# Patient Record
Sex: Female | Born: 1950 | Race: Black or African American | Hispanic: No | Marital: Single
Health system: Southern US, Community
[De-identification: ages and names within clinical notes are randomized; demographics above are authoritative.]

## PROBLEM LIST (undated history)

## (undated) HISTORY — PX: BREAST BIOPSY: SHX20

---

## 1999-08-17 HISTORY — PX: BREAST BIOPSY: SHX20

## 2016-11-08 ENCOUNTER — Encounter (INDEPENDENT_AMBULATORY_CARE_PROVIDER_SITE_OTHER): Payer: Self-pay

## 2016-11-08 ENCOUNTER — Ambulatory Visit: Payer: Self-pay | Attending: Oncology | Admitting: *Deleted

## 2016-11-08 ENCOUNTER — Ambulatory Visit
Admission: RE | Admit: 2016-11-08 | Discharge: 2016-11-08 | Disposition: A | Discharge status: Home / Self Care | Payer: Self-pay | Source: Ambulatory Visit | Attending: Oncology | Admitting: Oncology

## 2016-11-08 ENCOUNTER — Encounter: Payer: Self-pay | Admitting: *Deleted

## 2016-11-08 VITALS — BP 144/91 | HR 65 | Temp 97.4°F | Ht 63.0 in | Wt 150.0 lb

## 2016-11-08 DIAGNOSIS — Z0000 Encounter for general adult medical examination without abnormal findings: Secondary | ICD-10-CM

## 2016-11-08 NOTE — Patient Instructions (Signed)
Gave patient hand-out, Women Staying Healthy, Active and Well from [facility], with education on breast health, pap smears, heart and colon health. 

## 2016-11-08 NOTE — Progress Notes (Signed)
Subjective:     Patient ID: Marissa George, female   DOB: [birth year], 66 y.o.   MRN: [id number]  HPI   Review of Systems     Objective:   Physical Exam  Pulmonary/Chest: Right breast exhibits no inverted nipple, no mass, no nipple discharge, no skin change and no tenderness. Left breast exhibits no inverted nipple, no mass, no nipple discharge, no skin change and no tenderness. Breasts are symmetrical.         Assessment:     66 year old Black female [facility] and [facility].  Clinical breast exam unremarkable.  Taught self breast awareness.  [place] mammogram and pap smear was in [place] in 2016.  Explained that she will need to sign consent for release of information to get those images for comparison.  Explained we will do a pap smear next year.  Patient has been screened for eligibility.  She does not have any insurance, Medicare or Medicaid.  She also meets financial eligibility.  Hand-out given on the Affordable Care Act.    Plan:     Screening mammogram ordered.  Will follow-up per [facility] protocol.

## 2016-11-12 ENCOUNTER — Other Ambulatory Visit: Payer: Self-pay

## 2016-11-12 DIAGNOSIS — N63 Unspecified lump in unspecified breast: Secondary | ICD-10-CM

## 2016-11-24 ENCOUNTER — Ambulatory Visit
Admission: RE | Admit: 2016-11-24 | Discharge: 2016-11-24 | Disposition: A | Discharge status: Home / Self Care | Payer: Self-pay | Source: Ambulatory Visit | Attending: Oncology | Admitting: Oncology

## 2016-11-24 DIAGNOSIS — N63 Unspecified lump in unspecified breast: Secondary | ICD-10-CM

## 2016-11-25 ENCOUNTER — Encounter: Payer: Self-pay | Admitting: *Deleted

## 2016-11-25 NOTE — Progress Notes (Signed)
Letter mailed from the [facility] to inform patient of her normal mammogram results.  Patient is to follow-up with annual screening in one year.  HSIS to Christy. 

## 2016-11-30 ENCOUNTER — Other Ambulatory Visit: Payer: Self-pay | Admitting: *Deleted

## 2016-11-30 ENCOUNTER — Inpatient Hospital Stay
Admission: RE | Admit: 2016-11-30 | Discharge: 2016-11-30 | Disposition: A | Discharge status: Home / Self Care | Payer: Self-pay | Source: Ambulatory Visit | Attending: *Deleted | Admitting: *Deleted

## 2016-11-30 DIAGNOSIS — Z9289 Personal history of other medical treatment: Secondary | ICD-10-CM

## 2019-05-14 ENCOUNTER — Other Ambulatory Visit: Payer: Self-pay | Admitting: Student

## 2019-05-14 DIAGNOSIS — Z1231 Encounter for screening mammogram for malignant neoplasm of breast: Secondary | ICD-10-CM

## 2019-05-30 ENCOUNTER — Ambulatory Visit
Admission: RE | Admit: 2019-05-30 | Discharge: 2019-05-30 | Disposition: A | Discharge status: Home / Self Care | Payer: Medicare Other | Source: Ambulatory Visit | Attending: Family Medicine | Admitting: Family Medicine

## 2019-05-30 ENCOUNTER — Other Ambulatory Visit: Payer: Self-pay

## 2019-05-30 ENCOUNTER — Encounter (INDEPENDENT_AMBULATORY_CARE_PROVIDER_SITE_OTHER): Payer: Self-pay

## 2019-05-30 DIAGNOSIS — Z1231 Encounter for screening mammogram for malignant neoplasm of breast: Secondary | ICD-10-CM | POA: Insufficient documentation

## 2019-09-27 ENCOUNTER — Ambulatory Visit: Payer: Medicare Other

## 2020-05-06 ENCOUNTER — Other Ambulatory Visit: Payer: Self-pay | Admitting: Student

## 2021-07-29 IMAGING — MG DIGITAL SCREENING BILAT W/ TOMO W/ CAD
8 series · 8 of 24 positions shown · non-contrast
Comparison: Previous exam(s).

CLINICAL DATA: Screening.

EXAM:
DIGITAL SCREENING BILATERAL MAMMOGRAM WITH TOMO AND CAD

[R CC synth-2D]
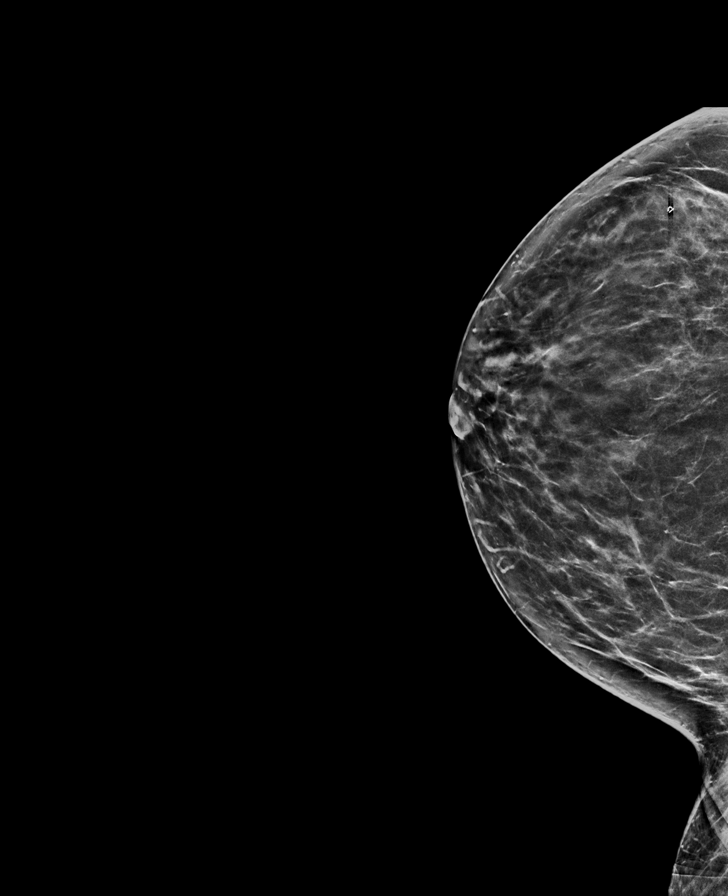

[L MLO synth-2D]
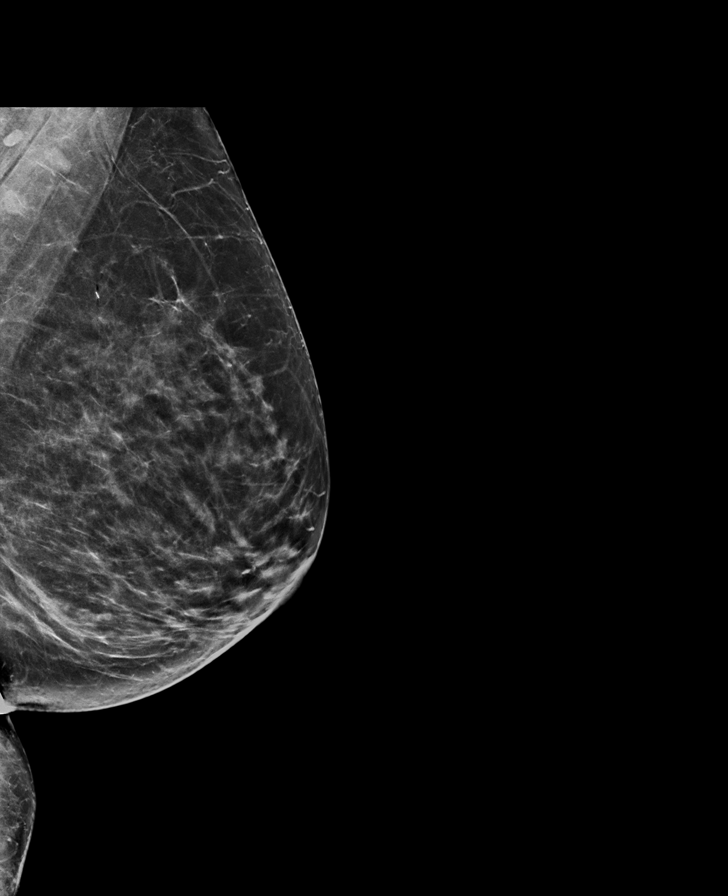

[L CC synth-2D]
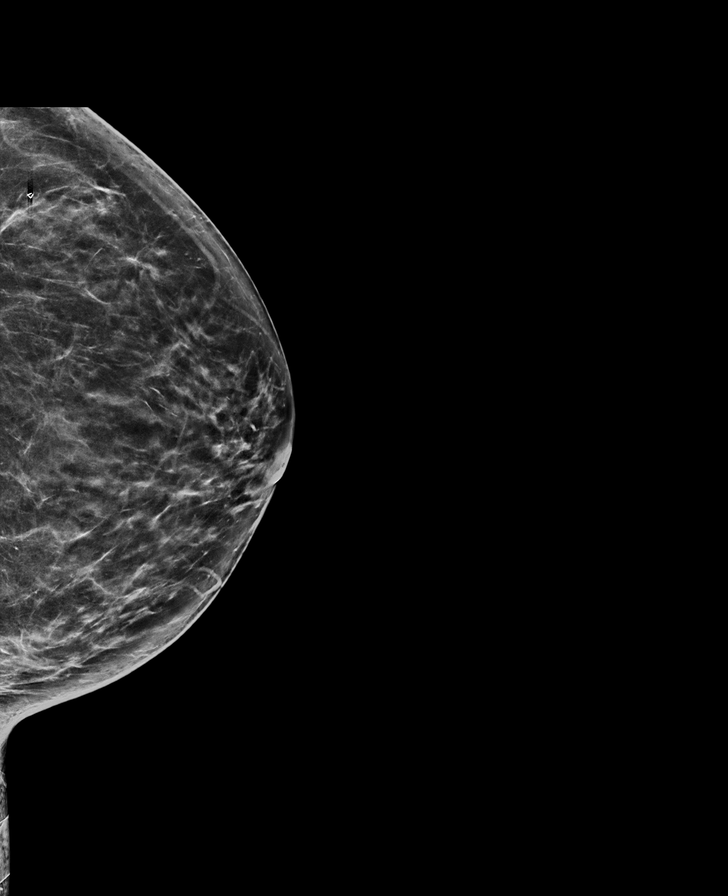

[R MLO synth-2D]
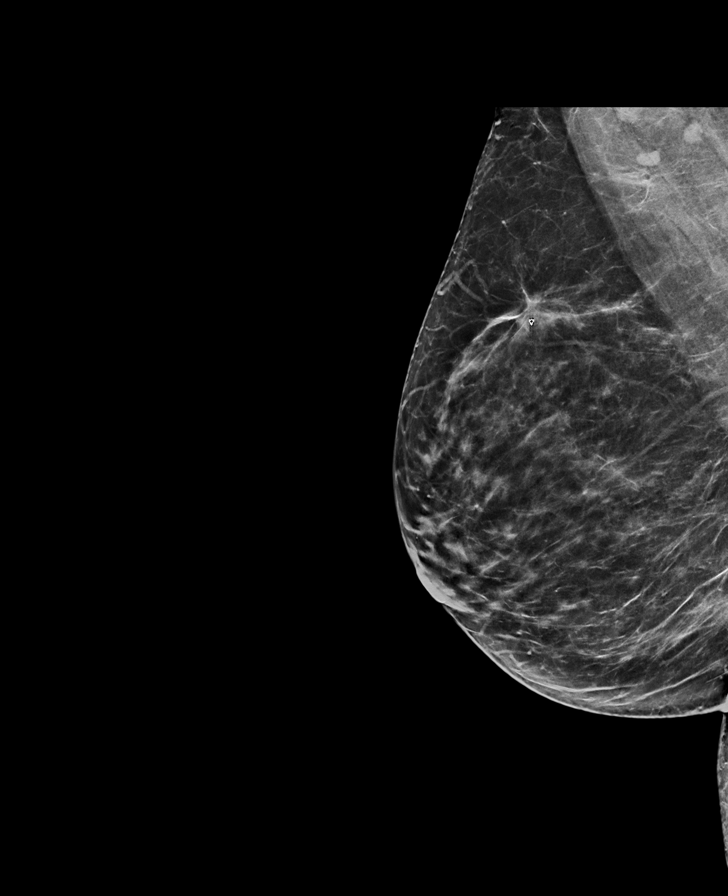

[L MLO tomo · tomo slice 39/76.0]
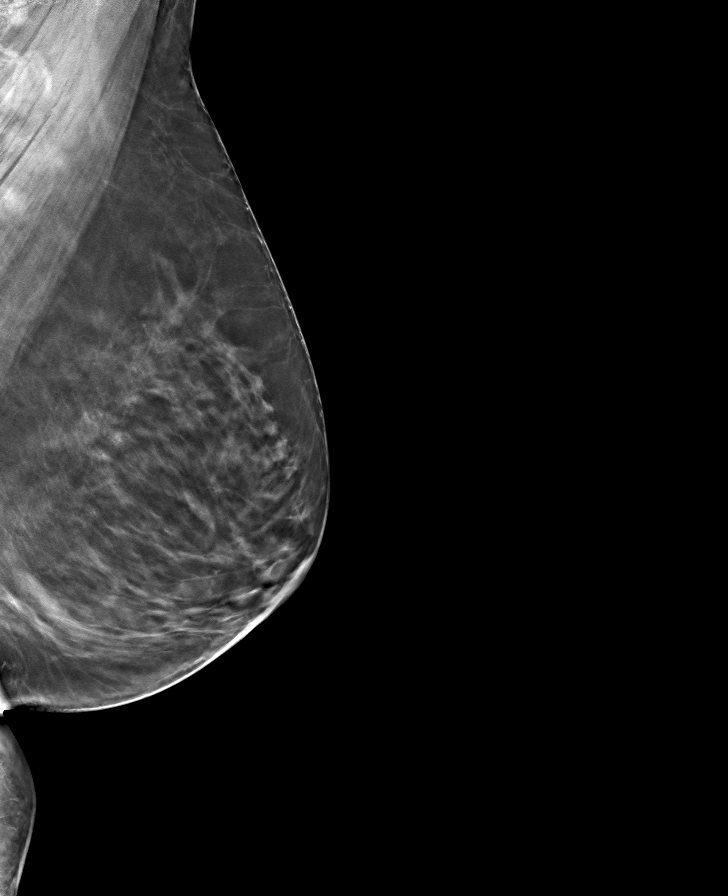

[L CC tomo · tomo slice 37/72.0]
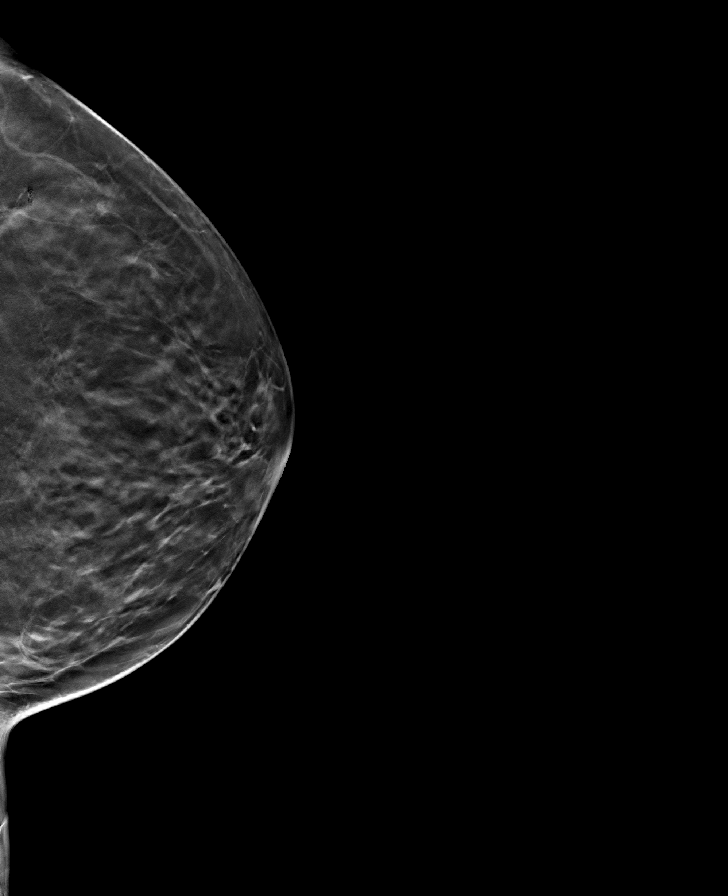

[R MLO tomo · tomo slice 38/75.0]
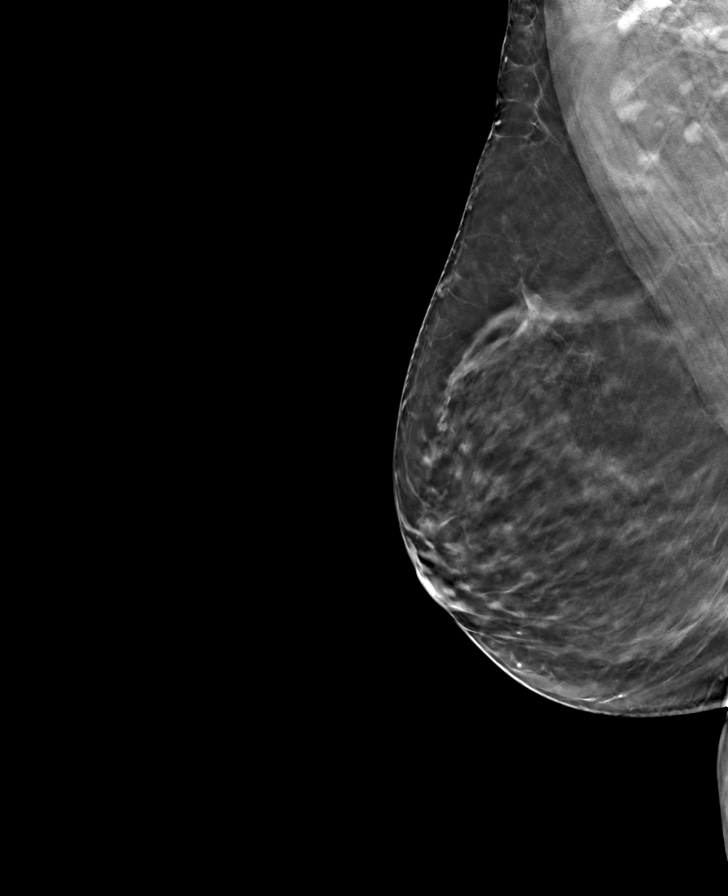

[R CC tomo · tomo slice 35/70.0]
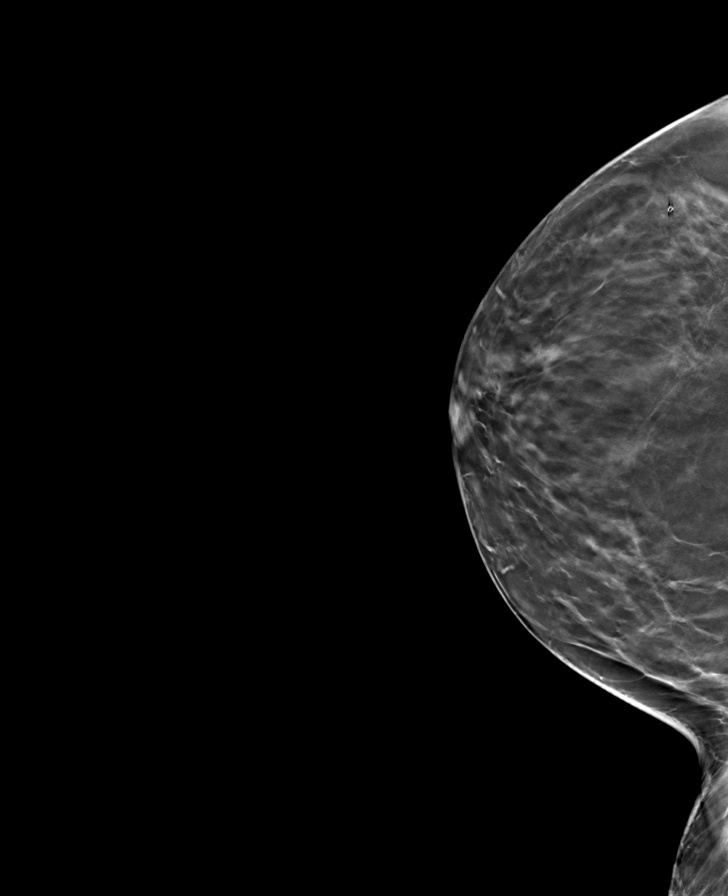

[8 of 24 positions shown; findings below may reference images not displayed]

ACR Breast Density Category c: The breast tissue is heterogeneously
dense, which may obscure small masses.
FINDINGS: There are no findings suspicious for malignancy. Images were
processed with CAD.
IMPRESSION: No mammographic evidence of malignancy. A result letter of this
screening mammogram will be mailed directly to the patient.

RECOMMENDATION:
Screening mammogram in one year. (Code:[id number]-[id number])

BI-RADS CATEGORY  1: Negative.

## 2023-01-17 ENCOUNTER — Ambulatory Visit: Payer: Medicare Other | Attending: Family Medicine

## 2023-01-17 DIAGNOSIS — M25511 Pain in right shoulder: Secondary | ICD-10-CM | POA: Insufficient documentation

## 2023-01-17 DIAGNOSIS — G8929 Other chronic pain: Secondary | ICD-10-CM | POA: Insufficient documentation

## 2023-01-17 DIAGNOSIS — M6281 Muscle weakness (generalized): Secondary | ICD-10-CM | POA: Diagnosis present

## 2023-01-17 NOTE — Therapy (Signed)
[facility] Physical Therapy [address] [place], [place], [address] Phone: [phone]   Fax:  [phone]  Outpatient Physical Therapy Evaluation  Patient Details  Name: Marissa George MRN: [id number] Date of Birth: Jul 04, 1951 Referring Provider (PT): Katherina Right MD (Primary care physician)   Encounter Date: 01/17/2023   PT End of Session - 01/17/23 0958     Visit Number 1    Number of Visits 16    Date for PT Re-Evaluation 03/14/23    Authorization Type Medicare A & B    Authorization Time Period 01/17/23-03/14/23    Progress Note Due on Visit 10    PT Start Time 0900    PT Stop Time 0945    PT Time Calculation (min) 45 min    Activity Tolerance Patient tolerated treatment well;No increased pain    Behavior During Therapy [facility] for tasks assessed/performed             No past medical history on file.  Past Surgical History:  Procedure Laterality Date   BREAST BIOPSY Left 2001   neg   BREAST BIOPSY Right    neg    There were no vitals filed for this visit.    Subjective Assessment -     Subjective Pt reports inisious injury to Rt shoulder, initially unable to lift arm into ABD or flexion, but better able to move things, it just feels weak and retricted, unable to tuck in shirts behind back on Rt or reach back of neck on Right.    Pertinent History Pt is referred to OPPT for acute ongoing Rt shoulder pain that started ~6 months prior to evaluation (early Jan 2024). She was referred to orthopedics but her appointment canceled 3x. 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    Currently in Pain? Yes    Pain Score 6     Pain Location Shoulder   sore in anterior shoulder   Pain Orientation Right                [facility] PT Assessment - 01/17/23 0001       Assessment    Medical Diagnosis Rt shoulder pain    Referring Provider (PT) Katherina Right MD   Primary care physician   Onset Date/Surgical Date 08/16/22   estimated   Hand Dominance Right    Prior Therapy For low back at another clinic, finished in December 2023      Balance Screen   Has the patient fallen in the past 6 months No    Has the patient had a decrease in activity level because of a fear of falling?  Yes    Is the patient reluctant to leave their home because of a fear of falling?  No      Prior Function   Level of Independence Independent;Independent with basic ADLs;Independent with gait;Independent with transfers;Independent with community mobility without device      Observation/Other Assessments   Focus on Therapeutic Outcomes (FOTO)  55      Sensation   Light Touch Impaired Detail    Light Touch Impaired Details Impaired RUE   has been followed for Rt hand numbness to volar digits 2, 3, and 4, no palmar sensory changes. 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, has been scheduled for NCS  Additional Assessment:  A/ROM:  Rt shoulder flexion: 128 degrees,  Rt shoulder ABDCT: 78 degrees  Right shoulder ER: T1  Right shoulder IR: T6  P/ROM:  Rt shoulder flexion 138 degrees (end range pain) Rt shoulder ABDCT: 114 degrees (end range pain)  Rt shoulder ER 70 degrees (guarded but able to get to end-range)  Rt shoulder IR: 65  MMT:  Rt shoulder IR 5/5 painful  Rt shoulder ER 5/5 (just a little sore) Rt shoulder flexion (supine 0-90), ok Right shoulder flexion (standing) 5/5 bilat  Right infraspinatus MMT: 5/5 pain free Right elbow flexion 4+/5 (r/o biceps tendon) ok Right extension 5/5 ok  Palpation: -Anterior shoulder tenderness with pain when Rt subscapularis tendon is exposed -lateral subscapularis palpation abnormal "feels like being poked with a needle"   Postural assessment:  -very mild anterior deviation of scapular with mild tightness Rt calvicular pec major, Pec  minor also tight, but both WNL.   Objective measurements completed on examination: See above findings.    HEP Education: -seated Rt shoulder adduction isometrics with towel roll 15x5secH  -seated blueTB row with endrange scapular retraction 1x10x3secH  -(unable to find a picture of shoulder isometric IR doorframe in a neutral GHJ position, deferred to next visit)      PT Education - 01/17/23 0957     Education Details Results of examination today and plan for addressing deficits; educated on HEP activity    Person(s) Educated Patient    Methods Explanation;Demonstration;Tactile cues;Verbal cues;Handout    Comprehension Verbalized understanding;Returned demonstration;Need further instruction;Tactile cues required;Verbal cues required              PT Short Term Goals -       PT SHORT TERM GOAL #1   Title Pt to reports tolerance and compliance to HEP assignment.    Baseline new    Time 2    Period Weeks    Status New    Target Date 01/31/23      PT SHORT TERM GOAL #2   Title Pt to report improved use of RUE in ADL and household activity, less pain, less perceived weakness.    Baseline compensating with LUE, arm feels weak when lifting heavy kitchen items    Time 3    Period Weeks    Status New    Target Date 02/07/23               PT Long Term Goals       PT LONG TERM GOAL #1   Title Pt to report less functional disability of RUE AEB score on FOTO survey score improvent >12.    Baseline eval: 55    Time 8    Period Weeks    Status New    Target Date 03/14/23      PT LONG TERM GOAL #2   Title Pt to demonstrate 5/5 and painfree MMT RUE shoulder flexion, ABDCT, IR, ER to indicated improved funcitonal load tolerance.    Baseline Pain with most of these at eval, most pain with resisted IR and deep palpation of subscapularis tendon.    Time 8    Period Weeks    Status New    Target Date 03/14/23      PT LONG TERM GOAL #3   Title Pt to report beginning to  integrate overhead lifts as desired to personal training session without exacerbation of pain.    Baseline eval: avoiding overhead lifts with trainer    Time 8  Period Weeks    Status New    Target Date 03/14/23               Plan -     Clinical Impression Statement Pt presenting with ongoinging stable Rt shoulder pain and weakness, limited ROM almost 6 months after inisdious severe loss of ROM and onset pain. Examination does not reveal any concerning neurological symptoms. Pain is focal to anterior shoulder, worse with deep palpation of subscapularis tendon. Pt has pain with resisted GHJ internal rotation, less with resisted external rotation. Pt is able to perform isometric shoulder flexion MMT without concerning weakness or pain. There are big gaps between A/ROM and P/ROM. Pt will benefit from skilled PT intervention to address deficits and impairment identified in this evaluation. Will plan on ~10 visits of PT to ascertain rehab prognosis, but if no significant improvement at that time, will further discuss advanced imaging and orthopedics consult.    Personal Factors and Comorbidities Fitness;Behavior Pattern;Time since onset of injury/illness/exacerbation    Examination-Activity Limitations Reach Overhead;Bathing;Carry;Dressing;Hygiene/Grooming    Examination-Participation Restrictions Meal Prep;Cleaning;Community Activity;Laundry;Yard Work    Stability/Clinical Decision Making Stable/Uncomplicated    Clinical Decision Making Moderate    Rehab Potential Excellent    PT Frequency 2x / week    PT Duration 8 weeks    PT Treatment/Interventions Electrical Stimulation;Moist Heat;Functional mobility training;Therapeutic exercise;Therapeutic activities;Patient/family education;Manual techniques;Dry needling;Passive range of motion    PT Next Visit Plan Review HEP from evaluation, find appopriate isometric for IR to add if tolerated in clinic.    PT Home Exercise Plan Eval: isometric  towel roll shoulder ADD, seated blueTB row    Consulted and Agree with Plan of Care Patient             Patient will benefit from skilled therapeutic intervention in order to improve the following deficits and impairments:  Impaired UE functional use, Increased muscle spasms, Decreased activity tolerance, Decreased strength, Impaired sensation  Visit Diagnosis: Muscle weakness (generalized)  Chronic right shoulder pain     Problem List There are no problems to display for this patient.  10:27 AM, 01/17/23 Rosamaria Lints, PT, DPT Physical Therapist - [facility] Therapy in [place]  [phone] [facility])     [place], PT 01/17/2023, 10:14 AM  [facility] Physical Therapy [address] [place], [place], [address] Phone: [phone]   Fax:  [phone]  Name: Marissa George MRN: [id number] Date of Birth: [birth year]

## 2023-01-19 ENCOUNTER — Ambulatory Visit: Payer: Medicare Other | Admitting: Physical Therapy

## 2023-01-19 ENCOUNTER — Encounter: Payer: Self-pay | Admitting: Physical Therapy

## 2023-01-19 DIAGNOSIS — M6281 Muscle weakness (generalized): Secondary | ICD-10-CM | POA: Diagnosis not present

## 2023-01-19 DIAGNOSIS — G8929 Other chronic pain: Secondary | ICD-10-CM

## 2023-01-19 NOTE — Therapy (Signed)
OUTPATIENT PHYSICAL THERAPY TREATMENT NOTE   Patient Name: Marissa George MRN: [id number] DOB:[birth year], 72 y.o., female Today's Date: 01/19/2023   END OF SESSION:   PT End of Session - 01/19/23 0938     Visit Number 2    Number of Visits 16    Date for PT Re-Evaluation 03/14/23    Authorization Type Medicare A & B    Authorization Time Period 01/17/23-03/14/23    Progress Note Due on Visit 10    PT Start Time 0908    PT Stop Time 0948    PT Time Calculation (min) 40 min    Activity Tolerance Patient tolerated treatment well;No increased pain    Behavior During Therapy [facility] for tasks assessed/performed             History reviewed. No pertinent past medical history. Past Surgical History:  Procedure Laterality Date   BREAST BIOPSY Left 2001   neg   BREAST BIOPSY Right    neg   There are no problems to display for this patient.    PCP: Antonieta Loveless, MD REFERRING PROVIDER: Katherina Right, MD  REFERRING DIAG: [phone] (ICD-10-CM) - Pain in right shoulder   THERAPY DIAG:  Muscle weakness (generalized)  Chronic right shoulder pain  Rationale for Evaluation and Treatment Rehabilitation  PERTINENT HISTORY: Pt reports inisious injury to Rt shoulder, initially unable to lift arm into ABD or flexion, but better able to move things, it just feels weak and retricted, unable to tuck in shirts behind back on Rt or reach back of neck on Right   Pt is referred to OPPT for acute ongoing Rt shoulder pain that started ~6 months prior to evaluation (early Jan 2024). She was referred to orthopedics but her appointment canceled 3x. 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 Patient reports some soreness after initial eval, but generally tolerating it well. She is awaiting NCV study for suspected carpel tunnel syndrome of R upper limb.    PAIN:  Are you having pain? Pt denies pain at rest, pain with resisted shoulder elevation and overhead activity/reaching behind back  OBJECTIVE: (objective measures completed at initial evaluation unless otherwise dated)      [facility] PT Assessment - 01/17/23 0001                Assessment    Medical Diagnosis Rt shoulder pain     Referring Provider (PT) Katherina Right MD   Primary care physician    Onset Date/Surgical Date 08/16/22   estimated    Hand Dominance Right     Prior Therapy For low back at another clinic, finished in December 2023          Balance Screen    Has the patient fallen in the past 6 months No     Has the patient had a decrease in activity level because of a fear of falling?  Yes     Is the patient reluctant to leave their home because of a fear of falling?  No          Prior Function    Level of Independence Independent;Independent with basic ADLs;Independent with gait;Independent with transfers;Independent with community mobility without device          Observation/Other Assessments    Focus on Therapeutic Outcomes (FOTO)  55          Sensation    Light Touch Impaired Detail     Light Touch Impaired Details Impaired RUE   has been followed for Rt hand numbness to volar digits 2, 3, and 4, no palmar sensory changes. 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, has been scheduled for NCS                A/ROM:  Rt shoulder flexion: 128 degrees,  Rt shoulder ABDCT: 78 degrees  Right shoulder ER: T1  Right shoulder IR: T6   P/ROM:  Rt shoulder flexion 138 degrees (end range pain) Rt shoulder ABDCT: 114  degrees (end range pain)  Rt shoulder ER 70 degrees (guarded but able to get to end-range)  Rt shoulder IR: 65   MMT:  Rt shoulder IR 5/5 painful  Rt shoulder ER 5/5 (just a little sore) Rt shoulder flexion (supine 0-90), ok Right shoulder flexion (standing) 5/5 bilat  Right infraspinatus MMT: 5/5 pain free Right elbow flexion 4+/5 (r/o biceps tendon) ok Right extension 5/5 ok   Palpation: -Anterior shoulder tenderness with pain when Rt subscapularis tendon is exposed -lateral subscapularis palpation abnormal "feels like being poked with a needle"    Postural assessment:  -very mild anterior deviation of scapulae with mild tightness Rt calvicular pec major, Pec minor also tight, but both WNL.    Passive Accessory Motion (01/19/23) Glenohumeral Joint: anterior glide: WNL, posterior glide: mild restriction, inferior glide: moderate restriction at 90 deg ABD      TREATMENT TODAY   Manual Therapy - for symptom modulation, soft tissue sensitivity and mobility, joint mobility, ROM   R shoulder PROM within pt tolerance x 2 minutes;   -min motion loss with all directions, pain with end-range flexion and abduction  Glenohumeral mobilization, inferior; gr II-III; 2 x 30 sec bouts per grade  STM R anterior and middle deltoid, pectoralis major x 8 minutes;  With R shoulder in 120 deg abduction, STM of R subscapularis; x 2 minutes    Trigger Point Dry Needling (TDN), unbilled Education performed with patient regarding potential benefit of TDN. Reviewed precautions and risks with patient. Extensive time spent with pt to ensure full understanding of TDN risks and post-treatment expectations. Pt provided verbal consent to treatment.  TDN performed to R middle deltoid, R pectoralis major, and R subscapularis with 0.25 x 40 single needle placements with local twitch response (LTR). Pistoning technique utilized. Improved pain-free motion following intervention.     Therapeutic Exercise -  for improved soft tissue flexibility and extensibility as needed for ROM, isometrics for analgesic effect and to promote soft tissue healing, periscapular re-training for scapular positioning and scapulothoracic mechanics   Pec stretch in doorway, shoulders at 90 deg abduction; 2 x 30 sec Shoulder IR isometric at door frame, x 10, 5 sec isometric hold   PATIENT EDUCATION: We discussed initial exercises given by Alvera Novel, PT, DPT. We discussed increased hydration and continued HEP following DN. HEP updated and new [employer] handout given.     HOME EXERCISE PROGRAM Per Alvera Novel, DPT at initial eval -seated Rt shoulder adduction isometrics with towel roll 15x5secH  -seated blueTB row with endrange scapular retraction 1x10x3secH   Access Code: [email or web address] URL: [email or web address] Date: 01/19/2023 Prepared by: Consuela Mimes  Exercises - Standing Isometric Shoulder Internal Rotation at Doorway  - 2 x daily - 7 x weekly - 2 sets - 10 reps - 5 sec hold - Doorway Pec Stretch at 90 Degrees Abduction  - 2 x daily - 7 x weekly - 3 sets - 30sec hold       PT Short Term Goals -                PT SHORT TERM GOAL #1    Title Pt to reports tolerance and compliance to HEP assignment.     Baseline Eval: baseline HEP initiated.     Time 2     Period Weeks     Status New     Target Date 01/31/23          PT SHORT TERM GOAL #2    Title Pt to report improved use of RUE in ADL and household activity, less pain, less perceived weakness.     Baseline Eval: compensating with LUE, arm feels weak when lifting heavy kitchen items.    Time 3     Period Weeks     Status New     Target Date 02/07/23                       PT Long Term Goals                PT LONG TERM GOAL #1    Title Pt to report less functional disability of RUE AEB score on FOTO survey score improvent >12.     Baseline eval: 55     Time 8     Period Weeks     Status New     Target Date  03/14/23          PT LONG TERM GOAL #2    Title Pt to demonstrate 5/5 and painfree MMT RUE shoulder flexion, ABD, IR, ER to indicated improved funcitonal load tolerance.     Baseline Pain with most of these at eval, most pain with resisted IR and deep palpation of subscapularis tendon.     Time 8     Period Weeks     Status New     Target Date 03/14/23          PT LONG TERM GOAL #3    Title Pt to report beginning to integrate overhead lifts as desired to personal training session without exacerbation of pain.  Baseline eval: avoiding overhead lifts with trainer     Time 8     Period Weeks     Status New     Target Date 03/14/23                       Plan -       Clinical Impression Statement Patient arrives with excellent motivation to participate in physical therapy. She has ongoing palpable tenderness and reproduction of "needle stick" feeling with subscapularis palpation. Pt also has TTP along middle/anterior deltoid and outer pectoralis major. She demonstrates improved shoulder flexion and abduction AROM relative to baseline IE measures following dry needling today. Pt tolerates doorway IR isometrics well. Her program was updated to include pec stretching and doorway IR isometric work. Patient has remaining deficits in shoulder AROM with minimal PROM motion deficits, mobility, postural changes/anteriorly tilted and protracted scapulae, taut/tender subscap/deltoid/pec, and local nociceptive anterior shoulder pain; pt has comorbid R upper limb paresthesias (awaiting NCV study). Patient will benefit from continued skilled therapeutic intervention to address the above deficits as needed for improved function and QoL.      Personal Factors and Comorbidities Fitness;Behavior Pattern;Time since onset of injury/illness/exacerbation     Examination-Activity Limitations Reach Overhead;Bathing;Carry;Dressing;Hygiene/Grooming     Examination-Participation Restrictions Meal  Prep;Cleaning;Community Activity;Laundry;Yard Work     Stability/Clinical Decision Making Stable/Uncomplicated     Clinical Decision Making Moderate     Rehab Potential Excellent     PT Frequency 2x / week     PT Duration 8 weeks     PT Treatment/Interventions Electrical Stimulation;Moist Heat;Functional mobility training;Therapeutic exercise;Therapeutic activities;Patient/family education;Manual techniques;Dry needling;Passive range of motion     PT Next Visit Plan F/u on tolerance of current HEP. Continue with specific stretching and periscapular re-training to reduce anterior scapular tilt/protraction, continued with isometrics. F/u on dry needling response. Progressive ROM with use of serratus activation/periscap re-training.    PT Home Exercise Plan See above    Consulted and Agree with Plan of Care Patient                   Patient will benefit from skilled therapeutic intervention in order to improve the following deficits and impairments:  Impaired UE functional use, Increased muscle spasms, Decreased activity tolerance, Decreased strength, Impaired sensation   Visit Diagnosis: Muscle weakness (generalized)   Chronic right shoulder pain      Consuela Mimes, PT, DPT #[id number]  Gertie Exon, PT 01/19/2023, 9:54 AM

## 2023-01-24 ENCOUNTER — Encounter: Payer: Self-pay | Admitting: Physical Therapy

## 2023-01-24 ENCOUNTER — Ambulatory Visit: Payer: Medicare Other | Admitting: Physical Therapy

## 2023-01-24 DIAGNOSIS — M6281 Muscle weakness (generalized): Secondary | ICD-10-CM | POA: Diagnosis not present

## 2023-01-24 DIAGNOSIS — G8929 Other chronic pain: Secondary | ICD-10-CM

## 2023-01-24 NOTE — Therapy (Signed)
OUTPATIENT PHYSICAL THERAPY TREATMENT NOTE   Patient Name: Marissa George MRN: [id number] DOB:[birth year], 72 y.o., female Today's Date: 01/24/2023   END OF SESSION:   PT End of Session - 01/24/23 1406     Visit Number 3    Number of Visits 16    Date for PT Re-Evaluation 03/14/23    Authorization Type Medicare A & B    Authorization Time Period 01/17/23-03/14/23    Progress Note Due on Visit 10    PT Start Time 1334    PT Stop Time 1417    PT Time Calculation (min) 43 min    Activity Tolerance Patient tolerated treatment well;No increased pain    Behavior During Therapy [facility] for tasks assessed/performed              History reviewed. No pertinent past medical history. Past Surgical History:  Procedure Laterality Date   BREAST BIOPSY Left 2001   neg   BREAST BIOPSY Right    neg   There are no problems to display for this patient.    PCP: Antonieta Loveless, MD REFERRING PROVIDER: Katherina Right, MD  REFERRING DIAG: [phone] (ICD-10-CM) - Pain in right shoulder   THERAPY DIAG:  Muscle weakness (generalized)  Chronic right shoulder pain  Rationale for Evaluation and Treatment Rehabilitation  PERTINENT HISTORY: Pt reports inisious injury to Rt shoulder, initially unable to lift arm into ABD or flexion, but better able to move things, it just feels weak and retricted, unable to tuck in shirts behind back on Rt or reach back of neck on Right   Pt is referred to OPPT for acute ongoing Rt shoulder pain that started ~6 months prior to evaluation (early Jan 2024). She was referred to orthopedics but her appointment canceled 3x. 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]      SUBJECTIVE:                                                                                                                                                                                       SUBJECTIVE STATEMENT:  Pt is awaiting NCV study. Patient reports some stiffness after leaving last visit. She reports it loosened up over the weekend. Patient reports no pain at rest. She reports doing well with home exercises last visit.    PAIN:  Are you having pain? Pt denies pain at rest, pain with resisted shoulder elevation and overhead activity/reaching behind  back    OBJECTIVE: (objective measures completed at initial evaluation unless otherwise dated)      [facility] PT Assessment - 01/17/23 0001                Assessment    Medical Diagnosis Rt shoulder pain     Referring Provider (PT) Katherina Right MD   Primary care physician    Onset Date/Surgical Date 08/16/22   estimated    Hand Dominance Right     Prior Therapy For low back at another clinic, finished in December 2023          Balance Screen    Has the patient fallen in the past 6 months No     Has the patient had a decrease in activity level because of a fear of falling?  Yes     Is the patient reluctant to leave their home because of a fear of falling?  No          Prior Function    Level of Independence Independent;Independent with basic ADLs;Independent with gait;Independent with transfers;Independent with community mobility without device          Observation/Other Assessments    Focus on Therapeutic Outcomes (FOTO)  55          Sensation    Light Touch Impaired Detail     Light Touch Impaired Details Impaired RUE   has been followed for Rt hand numbness to volar digits 2, 3, and 4, no palmar sensory changes. 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, has been scheduled for NCS                A/ROM:  Rt shoulder flexion: 128 degrees,  Rt shoulder ABDCT: 78 degrees  Right shoulder ER: T1  Right shoulder IR: T6   P/ROM:  Rt shoulder flexion 138 degrees (end range pain) Rt shoulder ABDCT: 114  degrees (end range pain)  Rt shoulder ER 70 degrees (guarded but able to get to end-range)  Rt shoulder IR: 65   MMT:  Rt shoulder IR 5/5 painful  Rt shoulder ER 5/5 (just a little sore) Rt shoulder flexion (supine 0-90), ok Right shoulder flexion (standing) 5/5 bilat  Right infraspinatus MMT: 5/5 pain free Right elbow flexion 4+/5 (r/o biceps tendon) ok Right extension 5/5 ok   Palpation: -Anterior shoulder tenderness with pain when Rt subscapularis tendon is exposed -lateral subscapularis palpation abnormal "feels like being poked with a needle"    Postural assessment:  -very mild anterior deviation of scapulae with mild tightness Rt calvicular pec major, Pec minor also tight, but both WNL.    Passive Accessory Motion (01/19/23) Glenohumeral Joint: anterior glide: WNL, posterior glide: mild restriction, inferior glide: moderate restriction at 90 deg ABD      TREATMENT TODAY   Manual Therapy - for symptom modulation, soft tissue sensitivity and mobility, joint mobility, ROM   R shoulder PROM within pt tolerance x 2 minutes;   -Mild motion loss with end-range flexion and ABD  Glenohumeral mobilization, inferior; gr III; 2 x 30 sec   STM R anterior and middle deltoid, pectoralis major x 5 minutes;  With R shoulder in 120 deg abduction, STM of R subscapularis; x 2 minutes    Trigger Point Dry Needling (TDN), unbilled Education performed with patient regarding potential benefit of TDN. Reviewed precautions and risks with patient. Extensive time spent with pt to ensure full understanding of TDN risks and post-treatment expectations. Pt provided verbal consent to treatment. TDN performed  to R anterior deltoid, R pectoralis major, and R subscapularis with 0.25 x 40 single needle placements with local twitch response (LTR). Pistoning technique utilized. Improved pain-free motion following intervention.     Therapeutic Exercise - for improved soft tissue flexibility and  extensibility as needed for ROM, isometrics for analgesic effect and to promote soft tissue healing, periscapular re-training for scapular positioning and scapulothoracic mechanics  Prone T; 2x10, 3 sec hold  -tactile and verbal cueing for scapular retraction/depression Pec stretch in doorway, shoulders at 90 deg abduction; 2 x 30 sec Serratus slide with foam roller at wall 1x10  PATIENT EDUCATION: We encouraged continued HEP and use of serratus slide 1x/day to maintain progress with shoulder elevation ROM tolerated    *not today* Shoulder IR isometric at door frame, x 10, 5 sec isometric hold     HOME EXERCISE PROGRAM Per Alvera Novel, DPT at initial eval -seated Rt shoulder adduction isometrics with towel roll 15x5secH  -seated blueTB row with endrange scapular retraction 1x10x3secH   Access Code: [email or web address] URL: [email or web address] Date: 01/19/2023 Prepared by: Consuela Mimes  Exercises - Standing Isometric Shoulder Internal Rotation at Doorway  - 2 x daily - 7 x weekly - 2 sets - 10 reps - 5 sec hold - Doorway Pec Stretch at 90 Degrees Abduction  - 2 x daily - 7 x weekly - 3 sets - 30sec hold       PT Short Term Goals -                PT SHORT TERM GOAL #1    Title Pt to reports tolerance and compliance to HEP assignment.     Baseline Eval: baseline HEP initiated.     Time 2     Period Weeks     Status New     Target Date 01/31/23          PT SHORT TERM GOAL #2    Title Pt to report improved use of RUE in ADL and household activity, less pain, less perceived weakness.     Baseline Eval: compensating with LUE, arm feels weak when lifting heavy kitchen items.    Time 3     Period Weeks     Status New     Target Date 02/07/23                       PT Long Term Goals                PT LONG TERM GOAL #1    Title Pt to report less functional disability of RUE AEB score on FOTO survey score improvent >12.     Baseline eval: 55     Time 8      Period Weeks     Status New     Target Date 03/14/23          PT LONG TERM GOAL #2    Title Pt to demonstrate 5/5 and painfree MMT RUE shoulder flexion, ABD, IR, ER to indicated improved funcitonal load tolerance.     Baseline Pain with most of these at eval, most pain with resisted IR and deep palpation of subscapularis tendon.     Time 8     Period Weeks     Status New     Target Date 03/14/23          PT LONG TERM GOAL #3    Title Pt to report beginning to integrate  overhead lifts as desired to personal training session without exacerbation of pain.     Baseline eval: avoiding overhead lifts with trainer     Time 8     Period Weeks     Status New     Target Date 03/14/23                       Plan -       Clinical Impression Statement Patient has notably improved tolerance of shoulder elevation following dry needling treatment; primary limitation with end-range flexion and abduction. She tolerates progression of periscapular isotonics and serratus activation well today. Patient has remaining deficits in shoulder AROM with minimal PROM motion deficits, mobility, postural changes/anteriorly tilted and protracted scapulae, taut/tender subscap/deltoid/pec, and local nociceptive anterior shoulder pain; pt has comorbid R upper limb paresthesias (awaiting NCV study). Patient will benefit from continued skilled therapeutic intervention to address the above deficits as needed for improved function and QoL.      Personal Factors and Comorbidities Fitness;Behavior Pattern;Time since onset of injury/illness/exacerbation     Examination-Activity Limitations Reach Overhead;Bathing;Carry;Dressing;Hygiene/Grooming     Examination-Participation Restrictions Meal Prep;Cleaning;Community Activity;Laundry;Yard Work     Stability/Clinical Decision Making Stable/Uncomplicated     Clinical Decision Making Moderate     Rehab Potential Excellent     PT Frequency 2x / week     PT Duration 8 weeks      PT Treatment/Interventions Electrical Stimulation;Moist Heat;Functional mobility training;Therapeutic exercise;Therapeutic activities;Patient/family education;Manual techniques;Dry needling;Passive range of motion     PT Next Visit Plan Continue with specific stretching and periscapular re-training to reduce anterior scapular tilt/protraction, continue with isometrics. F/u on dry needling response. Progressive ROM with use of serratus activation/periscap re-training.    PT Home Exercise Plan See above    Consulted and Agree with Plan of Care Patient                   Patient will benefit from skilled therapeutic intervention in order to improve the following deficits and impairments:  Impaired UE functional use, Increased muscle spasms, Decreased activity tolerance, Decreased strength, Impaired sensation   Visit Diagnosis: Muscle weakness (generalized)   Chronic right shoulder pain      Consuela Mimes, PT, DPT #[id number]  Gertie Exon, PT 01/24/2023, 2:45 PM

## 2023-01-26 ENCOUNTER — Ambulatory Visit: Payer: Medicare Other | Admitting: Physical Therapy

## 2023-01-26 ENCOUNTER — Encounter: Payer: Self-pay | Admitting: Physical Therapy

## 2023-01-26 DIAGNOSIS — M6281 Muscle weakness (generalized): Secondary | ICD-10-CM

## 2023-01-26 DIAGNOSIS — G8929 Other chronic pain: Secondary | ICD-10-CM

## 2023-01-26 NOTE — Therapy (Signed)
OUTPATIENT PHYSICAL THERAPY TREATMENT NOTE   Patient Name: Marissa George MRN: [id number] DOB:04/13/1951, 72 y.o., female Today's Date: 01/26/2023   END OF SESSION:   PT End of Session - 01/26/23 0858     Visit Number 4    Number of Visits 16    Date for PT Re-Evaluation 03/14/23    Authorization Type Medicare A & B    Authorization Time Period 01/17/23-03/14/23    Progress Note Due on Visit 10    PT Start Time 0858    PT Stop Time 0940    PT Time Calculation (min) 42 min    Activity Tolerance Patient tolerated treatment well;No increased pain    Behavior During Therapy [facility] for tasks assessed/performed               No past medical history on file. Past Surgical History:  Procedure Laterality Date   BREAST BIOPSY Left 2001   neg   BREAST BIOPSY Right    neg   There are no problems to display for this patient.    PCP: Antonieta Loveless, MD REFERRING PROVIDER: Katherina Right, MD  REFERRING DIAG: [phone] (ICD-10-CM) - Pain in right shoulder   THERAPY DIAG:  Muscle weakness (generalized)  Chronic right shoulder pain  Rationale for Evaluation and Treatment Rehabilitation  PERTINENT HISTORY: Pt reports inisious injury to Rt shoulder, initially unable to lift arm into ABD or flexion, but better able to move things, it just feels weak and retricted, unable to tuck in shirts behind back on Rt or reach back of neck on Right   Pt is referred to OPPT for acute ongoing Rt shoulder pain that started ~6 months prior to evaluation (early Jan 2024). She was referred to orthopedics but her appointment canceled 3x. 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]      SUBJECTIVE:                                                                                                                                                                                       SUBJECTIVE STATEMENT:  Pt reports doing well after last visit and she feels that shoulder elevation improved following DN. Pt reports doing well with current HEP. Pt is continuing personal training 3x/week.    PAIN:  Are you having pain? Pt denies pain at rest, pain with resisted shoulder elevation and overhead activity/reaching behind back    OBJECTIVE: (objective measures  completed at initial evaluation unless otherwise dated)      [facility] PT Assessment - 01/17/23 0001                Assessment    Medical Diagnosis Rt shoulder pain     Referring Provider (PT) Katherina Right MD   Primary care physician    Onset Date/Surgical Date 08/16/22   estimated    Hand Dominance Right     Prior Therapy For low back at another clinic, finished in December 2023          Balance Screen    Has the patient fallen in the past 6 months No     Has the patient had a decrease in activity level because of a fear of falling?  Yes     Is the patient reluctant to leave their home because of a fear of falling?  No          Prior Function    Level of Independence Independent;Independent with basic ADLs;Independent with gait;Independent with transfers;Independent with community mobility without device          Observation/Other Assessments    Focus on Therapeutic Outcomes (FOTO)  55          Sensation    Light Touch Impaired Detail     Light Touch Impaired Details Impaired RUE   has been followed for Rt hand numbness to volar digits 2, 3, and 4, no palmar sensory changes. 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, has been scheduled for NCS                A/ROM:  Rt shoulder flexion: 128 degrees,  Rt shoulder ABDCT: 78 degrees  Right shoulder ER: T1  Right shoulder IR: T6   P/ROM:  Rt shoulder flexion 138 degrees (end range pain) Rt shoulder ABDCT: 114 degrees (end range pain)  Rt shoulder ER 70  degrees (guarded but able to get to end-range)  Rt shoulder IR: 65   MMT:  Rt shoulder IR 5/5 painful  Rt shoulder ER 5/5 (just a little sore) Rt shoulder flexion (supine 0-90), ok Right shoulder flexion (standing) 5/5 bilat  Right infraspinatus MMT: 5/5 pain free Right elbow flexion 4+/5 (r/o biceps tendon) ok Right extension 5/5 ok   Palpation: -Anterior shoulder tenderness with pain when Rt subscapularis tendon is exposed -lateral subscapularis palpation abnormal "feels like being poked with a needle"    Postural assessment:  -very mild anterior deviation of scapulae with mild tightness Rt calvicular pec major, Pec minor also tight, but both WNL.    Passive Accessory Motion (01/19/23) Glenohumeral Joint: anterior glide: WNL, posterior glide: mild restriction, inferior glide: moderate restriction at 90 deg ABD      TREATMENT TODAY   Manual Therapy - for symptom modulation, soft tissue sensitivity and mobility, joint mobility, ROM   R shoulder PROM within pt tolerance x 2 minutes;   -Mild motion loss with end-range abduction. [place]ion PROM WNL   Glenohumeral mobilization, inferior; gr III; 2 x 30 sec at 90 deg abduction and 120 deg abduction   STM R anterior and middle deltoid, pectoralis major x 3 minutes;  With R shoulder in 120 deg abduction, STM of R subscapularis; x 2 minutes    Trigger Point Dry Needling (TDN), unbilled Education performed with patient regarding potential benefit of TDN. Reviewed precautions and risks with patient. Extensive time spent with pt to ensure full understanding of TDN risks and post-treatment expectations. Pt provided verbal consent to  treatment. TDN performed to  R pectoralis major and R subscapularis with 0.25 x 40 single needle placements with local twitch response (LTR). Pistoning technique utilized. Improved pain-free motion following intervention.     Therapeutic Exercise - for improved soft tissue flexibility and extensibility as  needed for ROM, isometrics for analgesic effect and to promote soft tissue healing, periscapular re-training for scapular positioning and scapulothoracic mechanics  Serratus slide with foam roller at wall 1x10 Prone T; 2x10, 3 sec hold  -tactile and verbal cueing for scapular retraction/depression Shoulder IR with Blue Tband; 2x0 Shoulder ADD with Red Tband, ABD AAROM; x20   PATIENT EDUCATION: Discussed expectations for PT progression with upcoming visits.     *not today* Pec stretch in doorway, shoulders at 90 deg abduction; 2 x 30 sec Shoulder IR isometric at door frame, x 10, 5 sec isometric hold     HOME EXERCISE PROGRAM Per Alvera Novel, DPT at initial eval -seated Rt shoulder adduction isometrics with towel roll 15x5secH  -seated blueTB row with endrange scapular retraction 1x10x3secH   Access Code: [email or web address] URL: [email or web address] Date: 01/19/2023 Prepared by: Consuela Mimes  Exercises - Standing Isometric Shoulder Internal Rotation at Doorway  - 2 x daily - 7 x weekly - 2 sets - 10 reps - 5 sec hold - Doorway Pec Stretch at 90 Degrees Abduction  - 2 x daily - 7 x weekly - 3 sets - 30sec hold       PT Short Term Goals -                PT SHORT TERM GOAL #1    Title Pt to reports tolerance and compliance to HEP assignment.     Baseline Eval: baseline HEP initiated.     Time 2     Period Weeks     Status New     Target Date 01/31/23          PT SHORT TERM GOAL #2    Title Pt to report improved use of RUE in ADL and household activity, less pain, less perceived weakness.     Baseline Eval: compensating with LUE, arm feels weak when lifting heavy kitchen items.    Time 3     Period Weeks     Status New     Target Date 02/07/23                       PT Long Term Goals                PT LONG TERM GOAL #1    Title Pt to report less functional disability of RUE AEB score on FOTO survey score improvent >12.     Baseline eval: 55      Time 8     Period Weeks     Status New     Target Date 03/14/23          PT LONG TERM GOAL #2    Title Pt to demonstrate 5/5 and painfree MMT RUE shoulder flexion, ABD, IR, ER to indicated improved funcitonal load tolerance.     Baseline Pain with most of these at eval, most pain with resisted IR and deep palpation of subscapularis tendon.     Time 8     Period Weeks     Status New     Target Date 03/14/23          PT LONG TERM GOAL #3  Title Pt to report beginning to integrate overhead lifts as desired to personal training session without exacerbation of pain.     Baseline eval: avoiding overhead lifts with trainer     Time 8     Period Weeks     Status New     Target Date 03/14/23                       Plan -       Clinical Impression Statement Patient exhibits forward elevation of R shoulder approaching functional ROM and she tolerates shoulder complex abduction better than previous sessions. She reports ongoing sensation of "pin sticking" into area with subscapularis palpation; pt has taut/tender pec major. Pt has excellent twitch responses with dry needling and is able to perform full overhead elevation with serratus slide and shoulder abduction up to 130 deg with shoulder adduction drill with Tband. Patient has remaining deficits in shoulder AROM with minimal PROM motion deficits, mobility, postural changes/anteriorly tilted and protracted scapulae, taut/tender subscap/deltoid/pec, and local nociceptive anterior shoulder pain; pt has comorbid R upper limb paresthesias (awaiting NCV study). Patient will benefit from continued skilled therapeutic intervention to address the above deficits as needed for improved function and QoL.      Personal Factors and Comorbidities Fitness;Behavior Pattern;Time since onset of injury/illness/exacerbation     Examination-Activity Limitations Reach Overhead;Bathing;Carry;Dressing;Hygiene/Grooming     Examination-Participation Restrictions  Meal Prep;Cleaning;Community Activity;Laundry;Yard Work     Stability/Clinical Decision Making Stable/Uncomplicated     Clinical Decision Making Moderate     Rehab Potential Excellent     PT Frequency 2x / week     PT Duration 8 weeks     PT Treatment/Interventions Electrical Stimulation;Moist Heat;Functional mobility training;Therapeutic exercise;Therapeutic activities;Patient/family education;Manual techniques;Dry needling;Passive range of motion     PT Next Visit Plan Continue with specific stretching and periscapular re-training to reduce anterior scapular tilt/protraction, continue with isometrics. F/u on dry needling response. Progressive ROM with use of serratus activation/periscap re-training.    PT Home Exercise Plan See above    Consulted and Agree with Plan of Care Patient                   Patient will benefit from skilled therapeutic intervention in order to improve the following deficits and impairments:  Impaired UE functional use, Increased muscle spasms, Decreased activity tolerance, Decreased strength, Impaired sensation   Visit Diagnosis: Muscle weakness (generalized)   Chronic right shoulder pain      Consuela Mimes, PT, DPT #[id number]  Gertie Exon, PT 01/26/2023, 8:58 AM

## 2023-01-31 ENCOUNTER — Encounter: Payer: Self-pay | Admitting: Physical Therapy

## 2023-01-31 ENCOUNTER — Ambulatory Visit: Payer: Medicare Other | Admitting: Physical Therapy

## 2023-01-31 DIAGNOSIS — M6281 Muscle weakness (generalized): Secondary | ICD-10-CM | POA: Diagnosis not present

## 2023-01-31 DIAGNOSIS — G8929 Other chronic pain: Secondary | ICD-10-CM

## 2023-01-31 NOTE — Therapy (Signed)
OUTPATIENT PHYSICAL THERAPY TREATMENT NOTE   Patient Name: Marissa George MRN: [id number] DOB:1950/09/01, 72 y.o., female Today's Date: 01/31/2023  END OF SESSION:   PT End of Session - 01/31/23 0906     Visit Number 5    Number of Visits 16    Date for PT Re-Evaluation 03/14/23    Authorization Type Medicare A & B    Authorization Time Period 01/17/23-03/14/23    Progress Note Due on Visit 10    PT Start Time 0903    PT Stop Time 0943    PT Time Calculation (min) 40 min    Activity Tolerance Patient tolerated treatment well;No increased pain    Behavior During Therapy [facility] for tasks assessed/performed                History reviewed. No pertinent past medical history. Past Surgical History:  Procedure Laterality Date   BREAST BIOPSY Left 2001   neg   BREAST BIOPSY Right    neg   There are no problems to display for this patient.    PCP: Antonieta Loveless, MD REFERRING PROVIDER: Katherina Right, MD  REFERRING DIAG: [phone] (ICD-10-CM) - Pain in right shoulder   THERAPY DIAG:  Muscle weakness (generalized)  Chronic right shoulder pain  Rationale for Evaluation and Treatment Rehabilitation  PERTINENT HISTORY: Pt reports inisious injury to Rt shoulder, initially unable to lift arm into ABD or flexion, but better able to move things, it just feels weak and retricted, unable to tuck in shirts behind back on Rt or reach back of neck on Right   Pt is referred to OPPT for acute ongoing Rt shoulder pain that started ~6 months prior to evaluation (early Jan 2024). She was referred to orthopedics but her appointment canceled 3x. 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]      SUBJECTIVE:                                                                                                                                                                                       SUBJECTIVE STATEMENT:  Pt reports that she is completing her HEP and performs PT exercises at gym. She reports moderate soreness after last visit. She reports doing well with reaching to low cabinets. She reports some difficulty remaining with overhead reaching/OH activity.    PAIN:  Are you having pain? Pt denies pain at rest, pain with resisted shoulder elevation  and overhead activity/reaching behind back    OBJECTIVE: (objective measures completed at initial evaluation unless otherwise dated)      [facility] PT Assessment - 01/17/23 0001                Assessment    Medical Diagnosis Rt shoulder pain     Referring Provider (PT) Katherina Right MD   Primary care physician    Onset Date/Surgical Date 08/16/22   estimated    Hand Dominance Right     Prior Therapy For low back at another clinic, finished in December 2023          Balance Screen    Has the patient fallen in the past 6 months No     Has the patient had a decrease in activity level because of a fear of falling?  Yes     Is the patient reluctant to leave their home because of a fear of falling?  No          Prior Function    Level of Independence Independent;Independent with basic ADLs;Independent with gait;Independent with transfers;Independent with community mobility without device          Observation/Other Assessments    Focus on Therapeutic Outcomes (FOTO)  55          Sensation    Light Touch Impaired Detail     Light Touch Impaired Details Impaired RUE   has been followed for Rt hand numbness to volar digits 2, 3, and 4, no palmar sensory changes. 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, has been scheduled for NCS                A/ROM:  Rt shoulder flexion: 128 degrees,  Rt shoulder ABDCT: 78 degrees  Right shoulder ER: T1  Right shoulder IR: T6   P/ROM:  Rt shoulder flexion 138 degrees (end  range pain) Rt shoulder ABDCT: 114 degrees (end range pain)  Rt shoulder ER 70 degrees (guarded but able to get to end-range)  Rt shoulder IR: 65   MMT:  Rt shoulder IR 5/5 painful  Rt shoulder ER 5/5 (just a little sore) Rt shoulder flexion (supine 0-90), ok Right shoulder flexion (standing) 5/5 bilat  Right infraspinatus MMT: 5/5 pain free Right elbow flexion 4+/5 (r/o biceps tendon) ok Right extension 5/5 ok   Palpation: -Anterior shoulder tenderness with pain when Rt subscapularis tendon is exposed -lateral subscapularis palpation abnormal "feels like being poked with a needle"    Postural assessment:  -very mild anterior deviation of scapulae with mild tightness Rt calvicular pec major, Pec minor also tight, but both WNL.    Passive Accessory Motion (01/19/23) Glenohumeral Joint: anterior glide: WNL, posterior glide: mild restriction, inferior glide: moderate restriction at 90 deg ABD       TREATMENT TODAY   Manual Therapy - for symptom modulation, soft tissue sensitivity and mobility, joint mobility, ROM   R shoulder PROM within pt tolerance x 3 minutes;   -Pt has full passive flexion and abduction with mild discomfort with end-range flexion; mild motion loss with ER at 90 deg ABD, IR WFL  Glenohumeral mobilization, inferior; gr III; 2 x 30 sec at 90 deg abduction and 120 deg abduction  GHJ A-P mobilization at gr II for pain control; 2 x 30 sec bouts    *not today* With R shoulder in 120 deg abduction, STM of R subscapularis; x 2 minutes STM R anterior and middle deltoid, pectoralis major x 3 minutes;  Therapeutic Exercise - for improved soft tissue flexibility and extensibility as needed for ROM, isometrics for analgesic effect and to promote soft tissue healing, periscapular re-training for scapular positioning and scapulothoracic mechanics   Prone T; 2x10, 3 sec hold  -tactile and verbal cueing for scapular retraction/depression Prone W; 2x10, 3 sec  hold  -tactile and verbal cueing for scapular retraction/depression Serratus slide with foam roller at wall 2x8, with Red Tband at wrists  Shoulder IR with Blue Tband; 2x10 Shoulder ADD with Red Tband, ABD AAROM; x20  Wall angel; attempted, difficulty maintaining ER and discomfort in anterior shoulder Modified --> Horizontal abduction in doorway with elbow flexed 90 deg; 2x10 with scap retraction    PATIENT EDUCATION: Discussed expectations for PT progression with upcoming visits.     *not today* Pec stretch in doorway, shoulders at 90 deg abduction; 2 x 30 sec Shoulder IR isometric at door frame, x 10, 5 sec isometric hold     HOME EXERCISE PROGRAM Per Alvera Novel, DPT at initial eval -seated Rt shoulder adduction isometrics with towel roll 15x5secH  -seated blueTB row with endrange scapular retraction 1x10x3secH   Access Code: [email or web address] URL: [email or web address] Date: 01/19/2023 Prepared by: Consuela Mimes  Exercises - Standing Isometric Shoulder Internal Rotation at Doorway  - 2 x daily - 7 x weekly - 2 sets - 10 reps - 5 sec hold - Doorway Pec Stretch at 90 Degrees Abduction  - 2 x daily - 7 x weekly - 3 sets - 30sec hold       PT Short Term Goals -                PT SHORT TERM GOAL #1    Title Pt to reports tolerance and compliance to HEP assignment.     Baseline Eval: baseline HEP initiated.     Time 2     Period Weeks     Status New     Target Date 01/31/23          PT SHORT TERM GOAL #2    Title Pt to report improved use of RUE in ADL and household activity, less pain, less perceived weakness.     Baseline Eval: compensating with LUE, arm feels weak when lifting heavy kitchen items.    Time 3     Period Weeks     Status New     Target Date 02/07/23                       PT Long Term Goals                PT LONG TERM GOAL #1    Title Pt to report less functional disability of RUE AEB score on FOTO survey score improvent  >12.     Baseline eval: 55     Time 8     Period Weeks     Status New     Target Date 03/14/23          PT LONG TERM GOAL #2    Title Pt to demonstrate 5/5 and painfree MMT RUE shoulder flexion, ABD, IR, ER to indicated improved funcitonal load tolerance.     Baseline Pain with most of these at eval, most pain with resisted IR and deep palpation of subscapularis tendon.     Time 8     Period Weeks     Status New     Target Date 03/14/23  PT LONG TERM GOAL #3    Title Pt to report beginning to integrate overhead lifts as desired to personal training session without exacerbation of pain.     Baseline eval: avoiding overhead lifts with trainer     Time 8     Period Weeks     Status New     Target Date 03/14/23                       Plan -       Clinical Impression Statement Patient demonstrates markedly improved tolerance of R shoulder PROM. She reports doing well with reaching tasks under shoulder height, but ongoing challenges remain with overhead reaching. Pt is able to attain shoulder complex abduction >130 deg with use of eccentric phase with resisted adduction c Tband. She tolerates overhead work with serratus slide well. Pt is notably challenged with attempted wall angel given active ER deficit at 90 deg abduction. Patient has remaining deficits in shoulder AROM with minimal PROM motion deficits, mobility, postural changes/anteriorly tilted and protracted scapulae, taut/tender subscap/deltoid/pec, and local nociceptive anterior shoulder pain; pt has comorbid R upper limb paresthesias (awaiting NCV study). Patient will benefit from continued skilled therapeutic intervention to address the above deficits as needed for improved function and QoL.      Personal Factors and Comorbidities Fitness;Behavior Pattern;Time since onset of injury/illness/exacerbation     Examination-Activity Limitations Reach Overhead;Bathing;Carry;Dressing;Hygiene/Grooming      Examination-Participation Restrictions Meal Prep;Cleaning;Community Activity;Laundry;Yard Work     Stability/Clinical Decision Making Stable/Uncomplicated     Clinical Decision Making Moderate     Rehab Potential Excellent     PT Frequency 2x / week     PT Duration 8 weeks     PT Treatment/Interventions Electrical Stimulation;Moist Heat;Functional mobility training;Therapeutic exercise;Therapeutic activities;Patient/family education;Manual techniques;Dry needling;Passive range of motion     PT Next Visit Plan Continue with specific stretching and periscapular re-training to reduce anterior scapular tilt/protraction, continue with isometrics. F/u on dry needling response. Progressive ROM with use of serratus activation/periscap re-training.    PT Home Exercise Plan See above    Consulted and Agree with Plan of Care Patient                   Patient will benefit from skilled therapeutic intervention in order to improve the following deficits and impairments:  Impaired UE functional use, Increased muscle spasms, Decreased activity tolerance, Decreased strength, Impaired sensation   Visit Diagnosis: Muscle weakness (generalized)   Chronic right shoulder pain      Consuela Mimes, PT, DPT #[id number]  Gertie Exon, PT 01/31/2023, 9:07 AM

## 2023-02-02 ENCOUNTER — Ambulatory Visit: Payer: Medicare Other | Admitting: Physical Therapy

## 2023-02-07 ENCOUNTER — Encounter: Payer: Self-pay | Admitting: Physical Therapy

## 2023-02-07 ENCOUNTER — Ambulatory Visit: Payer: Medicare Other | Admitting: Physical Therapy

## 2023-02-07 DIAGNOSIS — M6281 Muscle weakness (generalized): Secondary | ICD-10-CM | POA: Diagnosis not present

## 2023-02-07 DIAGNOSIS — G8929 Other chronic pain: Secondary | ICD-10-CM

## 2023-02-07 NOTE — Therapy (Signed)
OUTPATIENT PHYSICAL THERAPY TREATMENT NOTE   Patient Name: Marissa George MRN: [id number] DOB:[birth year], 72 y.o., female Today's Date: 02/07/2023  END OF SESSION:   PT End of Session - 02/07/23 0904     Visit Number 6    Number of Visits 16    Date for PT Re-Evaluation 03/14/23    Authorization Type Medicare A & B    Authorization Time Period 01/17/23-03/14/23    Progress Note Due on Visit 10    PT Start Time 0901    PT Stop Time 0945    PT Time Calculation (min) 44 min    Activity Tolerance Patient tolerated treatment well;No increased pain    Behavior During Therapy [facility] for tasks assessed/performed                 History reviewed. No pertinent past medical history. Past Surgical History:  Procedure Laterality Date   BREAST BIOPSY Left 2001   neg   BREAST BIOPSY Right    neg   There are no problems to display for this patient.    PCP: Antonieta Loveless, MD REFERRING PROVIDER: Katherina Right, MD  REFERRING DIAG: [phone] (ICD-10-CM) - Pain in right shoulder   THERAPY DIAG:  Muscle weakness (generalized)  Chronic right shoulder pain  Rationale for Evaluation and Treatment Rehabilitation  PERTINENT HISTORY: Pt reports inisious injury to Rt shoulder, initially unable to lift arm into ABD or flexion, but better able to move things, it just feels weak and retricted, unable to tuck in shirts behind back on Rt or reach back of neck on Right   Pt is referred to OPPT for acute ongoing Rt shoulder pain that started ~6 months prior to evaluation (early Jan 2024). She was referred to orthopedics but her appointment canceled 3x. 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]      SUBJECTIVE:                                                                                                                                                                                       SUBJECTIVE STATEMENT:  Pt reports feeling somewhat stiff in her R shoulder after working on ROM at home and completing gardening work. Patient reports doing well with her HEP. She is going to gym to work with Systems analyst after completing PT today.    PAIN:  Are you having pain? Pt denies pain at rest, pain with resisted  shoulder elevation and overhead activity/reaching behind back    OBJECTIVE: (objective measures completed at initial evaluation unless otherwise dated)      [facility] PT Assessment - 01/17/23 0001                Assessment    Medical Diagnosis Rt shoulder pain     Referring Provider (PT) Katherina Right MD   Primary care physician    Onset Date/Surgical Date 08/16/22   estimated    Hand Dominance Right     Prior Therapy For low back at another clinic, finished in December 2023          Balance Screen    Has the patient fallen in the past 6 months No     Has the patient had a decrease in activity level because of a fear of falling?  Yes     Is the patient reluctant to leave their home because of a fear of falling?  No          Prior Function    Level of Independence Independent;Independent with basic ADLs;Independent with gait;Independent with transfers;Independent with community mobility without device          Observation/Other Assessments    Focus on Therapeutic Outcomes (FOTO)  55          Sensation    Light Touch Impaired Detail     Light Touch Impaired Details Impaired RUE   has been followed for Rt hand numbness to volar digits 2, 3, and 4, no palmar sensory changes. 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, has been scheduled for NCS                A/ROM:  Rt shoulder flexion: 128 degrees,  Rt shoulder ABDCT: 78 degrees  Right shoulder ER: T1  Right shoulder IR: T6   P/ROM:  Rt shoulder flexion 138 degrees (end range pain) Rt  shoulder ABDCT: 114 degrees (end range pain)  Rt shoulder ER 70 degrees (guarded but able to get to end-range)  Rt shoulder IR: 65   MMT:  Rt shoulder IR 5/5 painful  Rt shoulder ER 5/5 (just a little sore) Rt shoulder flexion (supine 0-90), ok Right shoulder flexion (standing) 5/5 bilat  Right infraspinatus MMT: 5/5 pain free Right elbow flexion 4+/5 (r/o biceps tendon) ok Right extension 5/5 ok   Palpation: -Anterior shoulder tenderness with pain when Rt subscapularis tendon is exposed -lateral subscapularis palpation abnormal "feels like being poked with a needle"    Postural assessment:  -very mild anterior deviation of scapulae with mild tightness Rt calvicular pec major, Pec minor also tight, but both WNL.    Passive Accessory Motion (01/19/23) Glenohumeral Joint: anterior glide: WNL, posterior glide: mild restriction, inferior glide: moderate restriction at 90 deg ABD       TREATMENT TODAY   Manual Therapy - for symptom modulation, soft tissue sensitivity and mobility, joint mobility, ROM   R shoulder PROM within pt tolerance x 3 minutes;   -Pt has full passive flexion and abduction with mild discomfort with end-range flexion; mild motion loss with ER at 90 deg ABD, IR WFL  Glenohumeral mobilization, inferior; gr III; 2 x 30 sec at 90 deg abduction and 140 deg abduction  GHJ A-P mobilization at gr II for pain control; 2 x 30 sec bouts  STM R anterior and middle deltoid  x 3 minutes;   *not today* With R shoulder in 120 deg abduction, STM of R subscapularis; x 2 minutes  STM R anterior and middle deltoid, pectoralis major x 3 minutes;    Trigger Point Dry Needling (TDN), unbilled Education performed with patient regarding potential benefits and mild adverse effects of TDN at previous visit.  Pt provided verbal consent to treatment. TDN performed to R middle deltoid with 0.25 x 40 single needle placements with local twitch response (LTR). Pistoning technique  utilized. Improved pain-free motion following intervention.     Therapeutic Exercise - for improved soft tissue flexibility and extensibility as needed for ROM, isometrics for analgesic effect and to promote soft tissue healing, periscapular re-training for scapular positioning and scapulothoracic mechanics  Upper body ergometer, 2 minutes forward, 2 minutes backward - for tissue warm-up to improve muscle performance, improved soft tissue mobility/extensibility - subjective information gathered during this time   Prone W; 2x10, 3 sec hold  -tactile and verbal cueing for scapular retraction/depression  Serratus slide with foam roller at wall 2x8, with Red Tband at wrists  Shoulder IR with Black Tband; 2x10 Shoulder ADD with Green Tband, ABD AAROM; x20   PATIENT EDUCATION: Verbal and tactile cueing as well as PT demo for carryover of exercise technique.     *not today* Horizontal abduction in doorway with elbow flexed 90 deg; 2x10 with scap retraction Wall angel; attempted, difficulty maintaining ER and discomfort in anterior shoulder Prone T; 2x10, 3 sec hold  -tactile and verbal cueing for scapular retraction/depression Pec stretch in doorway, shoulders at 90 deg abduction; 2 x 30 sec Shoulder IR isometric at door frame, x 10, 5 sec isometric hold     HOME EXERCISE PROGRAM Per Alvera Novel, DPT at initial eval -seated Rt shoulder adduction isometrics with towel roll 15x5secH  -seated blueTB row with endrange scapular retraction 1x10x3secH   Access Code: [email or web address] URL: [email or web address] Date: 01/19/2023 Prepared by: Consuela Mimes  Exercises - Standing Isometric Shoulder Internal Rotation at Doorway  - 2 x daily - 7 x weekly - 2 sets - 10 reps - 5 sec hold - Doorway Pec Stretch at 90 Degrees Abduction  - 2 x daily - 7 x weekly - 3 sets - 30sec hold       PT Short Term Goals -                PT SHORT TERM GOAL #1    Title Pt to reports tolerance  and compliance to HEP assignment.     Baseline Eval: baseline HEP initiated.     Time 2     Period Weeks     Status New     Target Date 01/31/23          PT SHORT TERM GOAL #2    Title Pt to report improved use of RUE in ADL and household activity, less pain, less perceived weakness.     Baseline Eval: compensating with LUE, arm feels weak when lifting heavy kitchen items.    Time 3     Period Weeks     Status New     Target Date 02/07/23                       PT Long Term Goals                PT LONG TERM GOAL #1    Title Pt to report less functional disability of RUE AEB score on FOTO survey score improvent >12.     Baseline eval: 55     Time 8  Period Weeks     Status New     Target Date 03/14/23          PT LONG TERM GOAL #2    Title Pt to demonstrate 5/5 and painfree MMT RUE shoulder flexion, ABD, IR, ER to indicated improved funcitonal load tolerance.     Baseline Pain with most of these at eval, most pain with resisted IR and deep palpation of subscapularis tendon.     Time 8     Period Weeks     Status New     Target Date 03/14/23          PT LONG TERM GOAL #3    Title Pt to report beginning to integrate overhead lifts as desired to personal training session without exacerbation of pain.     Baseline eval: avoiding overhead lifts with trainer     Time 8     Period Weeks     Status New     Target Date 03/14/23                       Plan -       Clinical Impression Statement Patient tolerates shoulder complex abduction to overhead well with use of eccentric loading (resisted shoulder adduction drill). She exhibits symmetrical forward elevation with BUE, but she does have remaining discomfort with R shoulder forward elevation. She has taut and tender R middle deltoid that is reproductive of symptoms with palpation; we utilized DN today for this area with sound twitch obtained. Pt reports mild post-DN dull discomfort. Patient has remaining deficits in  shoulder AROM with minimal PROM motion deficits, mobility, postural changes/anteriorly tilted and protracted scapulae, taut/tender subscap/deltoid/pec, and local nociceptive anterior shoulder pain; pt has comorbid R upper limb paresthesias (awaiting NCV study). Patient will benefit from continued skilled therapeutic intervention to address the above deficits as needed for improved function and QoL.      Personal Factors and Comorbidities Fitness;Behavior Pattern;Time since onset of injury/illness/exacerbation     Examination-Activity Limitations Reach Overhead;Bathing;Carry;Dressing;Hygiene/Grooming     Examination-Participation Restrictions Meal Prep;Cleaning;Community Activity;Laundry;Yard Work     Stability/Clinical Decision Making Stable/Uncomplicated     Clinical Decision Making Moderate     Rehab Potential Excellent     PT Frequency 2x / week     PT Duration 8 weeks     PT Treatment/Interventions Electrical Stimulation;Moist Heat;Functional mobility training;Therapeutic exercise;Therapeutic activities;Patient/family education;Manual techniques;Dry needling;Passive range of motion     PT Next Visit Plan Continue with specific stretching and periscapular re-training to reduce anterior scapular tilt/protraction, continue with isometrics. Progressive ROM with use of serratus activation/periscap re-training. F/u on response with dry needling.     PT Home Exercise Plan See above    Consulted and Agree with Plan of Care Patient                   Patient will benefit from skilled therapeutic intervention in order to improve the following deficits and impairments:  Impaired UE functional use, Increased muscle spasms, Decreased activity tolerance, Decreased strength, Impaired sensation   Visit Diagnosis: Muscle weakness (generalized)   Chronic right shoulder pain      Consuela Mimes, PT, DPT #[id number]  Gertie Exon, PT 02/07/2023, 11:24 AM

## 2023-02-09 ENCOUNTER — Ambulatory Visit: Payer: Medicare Other | Admitting: Physical Therapy

## 2023-02-09 ENCOUNTER — Encounter: Payer: Self-pay | Admitting: Physical Therapy

## 2023-02-09 DIAGNOSIS — M6281 Muscle weakness (generalized): Secondary | ICD-10-CM

## 2023-02-09 DIAGNOSIS — G8929 Other chronic pain: Secondary | ICD-10-CM

## 2023-02-09 NOTE — Therapy (Signed)
OUTPATIENT PHYSICAL THERAPY TREATMENT NOTE   Patient Name: Marissa George MRN: [id number] DOB:12-28-1950, 72 y.o., female Today's Date: 02/09/2023  END OF SESSION:   PT End of Session - 02/09/23 0940     Visit Number 7    Number of Visits 16    Date for PT Re-Evaluation 03/14/23    Authorization Type Medicare A & B    Authorization Time Period 01/17/23-03/14/23    Progress Note Due on Visit 10    PT Start Time 0910    PT Stop Time 0953    PT Time Calculation (min) 43 min    Activity Tolerance Patient tolerated treatment well;No increased pain    Behavior During Therapy [facility] for tasks assessed/performed              History reviewed. No pertinent past medical history. Past Surgical History:  Procedure Laterality Date   BREAST BIOPSY Left 2001   neg   BREAST BIOPSY Right    neg   There are no problems to display for this patient.    PCP: Antonieta Loveless, MD REFERRING PROVIDER: Katherina Right, MD  REFERRING DIAG: [phone] (ICD-10-CM) - Pain in right shoulder   THERAPY DIAG:  Muscle weakness (generalized)  Chronic right shoulder pain  Rationale for Evaluation and Treatment Rehabilitation  PERTINENT HISTORY: Pt reports inisious injury to Rt shoulder, initially unable to lift arm into ABD or flexion, but better able to move things, it just feels weak and retricted, unable to tuck in shirts behind back on Rt or reach back of neck on Right   Pt is referred to OPPT for acute ongoing Rt shoulder pain that started ~6 months prior to evaluation (early Jan 2024). She was referred to orthopedics but her appointment canceled 3x. 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]      SUBJECTIVE:                                                                                                                                                                                       SUBJECTIVE STATEMENT:  Pt reports feeling generally well this AM. She reports using her R arm more to reach overhead and placing items in overhead cabinets. She is still challenged with moving Pyrex dish. Pt is compliant with HEP and continuing with personal training.    PAIN:  Are you having pain? Pt denies pain at rest, pain with resisted shoulder elevation  and overhead activity/reaching behind back    OBJECTIVE: (objective measures completed at initial evaluation unless otherwise dated)      [facility] PT Assessment - 01/17/23 0001                Assessment    Medical Diagnosis Rt shoulder pain     Referring Provider (PT) Katherina Right MD   Primary care physician    Onset Date/Surgical Date 08/16/22   estimated    Hand Dominance Right     Prior Therapy For low back at another clinic, finished in December 2023          Balance Screen    Has the patient fallen in the past 6 months No     Has the patient had a decrease in activity level because of a fear of falling?  Yes     Is the patient reluctant to leave their home because of a fear of falling?  No          Prior Function    Level of Independence Independent;Independent with basic ADLs;Independent with gait;Independent with transfers;Independent with community mobility without device          Observation/Other Assessments    Focus on Therapeutic Outcomes (FOTO)  55          Sensation    Light Touch Impaired Detail     Light Touch Impaired Details Impaired RUE   has been followed for Rt hand numbness to volar digits 2, 3, and 4, no palmar sensory changes. 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, has been scheduled for NCS                A/ROM:  Rt shoulder flexion: 128 degrees,  Rt shoulder ABDCT: 78 degrees  Right shoulder ER: T1  Right shoulder IR: T6   P/ROM:  Rt shoulder flexion 138 degrees (end range  pain) Rt shoulder ABDCT: 114 degrees (end range pain)  Rt shoulder ER 70 degrees (guarded but able to get to end-range)  Rt shoulder IR: 65   MMT:  Rt shoulder IR 5/5 painful  Rt shoulder ER 5/5 (just a little sore) Rt shoulder flexion (supine 0-90), ok Right shoulder flexion (standing) 5/5 bilat  Right infraspinatus MMT: 5/5 pain free Right elbow flexion 4+/5 (r/o biceps tendon) ok Right extension 5/5 ok   Palpation: -Anterior shoulder tenderness with pain when Rt subscapularis tendon is exposed -lateral subscapularis palpation abnormal "feels like being poked with a needle"    Postural assessment:  -very mild anterior deviation of scapulae with mild tightness Rt calvicular pec major, Pec minor also tight, but both WNL.    Passive Accessory Motion (01/19/23) Glenohumeral Joint: anterior glide: WNL, posterior glide: mild restriction, inferior glide: moderate restriction at 90 deg ABD       TREATMENT TODAY   Manual Therapy - for symptom modulation, soft tissue sensitivity and mobility, joint mobility, ROM   R shoulder PROM within pt tolerance x 3 minutes;   -Pt has full passive flexion and abduction with mild discomfort with end-range flexion; mild motion loss with ER at 90 deg ABD, IR WFL  Glenohumeral mobilization, inferior; gr III; 2 x 30 sec at 90 deg abduction and 140 deg abduction  GHJ A-P mobilization at gr II for pain control; 2 x 30 sec bouts  STM R anterior and middle deltoid  x 3 minutes;   *not today* With R shoulder in 120 deg abduction, STM of R subscapularis; x 2 minutes STM R  anterior and middle deltoid, pectoralis major x 3 minutes; Trigger Point Dry Needling (TDN), unbilled Education performed with patient regarding potential benefits and mild adverse effects of TDN at previous visit.  Pt provided verbal consent to treatment. TDN performed to R middle deltoid with 0.25 x 40 single needle placements with local twitch response (LTR). Pistoning technique  utilized. Improved pain-free motion following intervention.     Therapeutic Exercise - for improved soft tissue flexibility and extensibility as needed for ROM, isometrics for analgesic effect and to promote soft tissue healing, periscapular re-training for scapular positioning and scapulothoracic mechanics  Upper body ergometer, 2 minutes forward, 2 minutes backward - for tissue warm-up to improve muscle performance, improved soft tissue mobility/extensibility - subjective information gathered during this time  Wall Y with Green Tband; 2x10  Shoulder IR with Black Tband; 2x10  Standing scaption with 2-lb Dbells bilat UE; 2x10  -mirror feedback to attain scapular plane and maintain proper posture  Tband high row, Blue Tband; 2x10  PATIENT EDUCATION: Verbal and tactile cueing as well as PT demo for carryover of exercise technique. We discussed current progress and prognosis c PT.     *not today* Serratus slide with foam roller at wall 2x8, with Red Tband at wrists Prone W; 2x10, 3 sec hold Shoulder ADD with Green Tband, ABD AAROM; x20 Horizontal abduction in doorway with elbow flexed 90 deg; 2x10 with scap retraction Wall angel; attempted, difficulty maintaining ER and discomfort in anterior shoulder Prone T; 2x10, 3 sec hold  -tactile and verbal cueing for scapular retraction/depression Pec stretch in doorway, shoulders at 90 deg abduction; 2 x 30 sec Shoulder IR isometric at door frame, x 10, 5 sec isometric hold     HOME EXERCISE PROGRAM Per Alvera Novel, DPT at initial eval -seated Rt shoulder adduction isometrics with towel roll 15x5secH  -seated blueTB row with endrange scapular retraction 1x10x3secH   Access Code: [email or web address] URL: [email or web address] Date: 01/19/2023 Prepared by: Consuela Mimes  Exercises - Standing Isometric Shoulder Internal Rotation at Doorway  - 2 x daily - 7 x weekly - 2 sets - 10 reps - 5 sec hold - Doorway Pec Stretch at 90  Degrees Abduction  - 2 x daily - 7 x weekly - 3 sets - 30sec hold       PT Short Term Goals -                PT SHORT TERM GOAL #1    Title Pt to reports tolerance and compliance to HEP assignment.     Baseline Eval: baseline HEP initiated.     Time 2     Period Weeks     Status New     Target Date 01/31/23          PT SHORT TERM GOAL #2    Title Pt to report improved use of RUE in ADL and household activity, less pain, less perceived weakness.     Baseline Eval: compensating with LUE, arm feels weak when lifting heavy kitchen items.    Time 3     Period Weeks     Status New     Target Date 02/07/23                       PT Long Term Goals                PT LONG TERM GOAL #1    Title Pt to report less functional disability  of RUE AEB score on FOTO survey score improvent >12.     Baseline eval: 55     Time 8     Period Weeks     Status New     Target Date 03/14/23          PT LONG TERM GOAL #2    Title Pt to demonstrate 5/5 and painfree MMT RUE shoulder flexion, ABD, IR, ER to indicated improved funcitonal load tolerance.     Baseline Pain with most of these at eval, most pain with resisted IR and deep palpation of subscapularis tendon.     Time 8     Period Weeks     Status New     Target Date 03/14/23          PT LONG TERM GOAL #3    Title Pt to report beginning to integrate overhead lifts as desired to personal training session without exacerbation of pain.     Baseline eval: avoiding overhead lifts with trainer     Time 8     Period Weeks     Status New     Target Date 03/14/23                       Plan -       Clinical Impression Statement Patient exhibits normalizing shoulder elevation AROM and she is able to access full overhead range with lower trap activation drill (wall Y with Tband). We are able to initiate standing shoulder elevation (utilized scaption) with resistance without notable onset of pain, only noticeable difference in  perceived load between R and LUE. Patient has remaining deficits in shoulder AROM in flexion/abduction with minimal PROM motion deficits, mobility, postural changes/anteriorly tilted and protracted scapulae, taut/tender subscap/deltoid/pec, and local nociceptive anterior shoulder pain; pt has comorbid R upper limb paresthesias (awaiting NCV study). Patient will benefit from continued skilled therapeutic intervention to address the above deficits as needed for improved function and QoL.      Personal Factors and Comorbidities Fitness;Behavior Pattern;Time since onset of injury/illness/exacerbation     Examination-Activity Limitations Reach Overhead;Bathing;Carry;Dressing;Hygiene/Grooming     Examination-Participation Restrictions Meal Prep;Cleaning;Community Activity;Laundry;Yard Work     Stability/Clinical Decision Making Stable/Uncomplicated     Clinical Decision Making Moderate     Rehab Potential Excellent     PT Frequency 2x / week     PT Duration 8 weeks     PT Treatment/Interventions Electrical Stimulation;Moist Heat;Functional mobility training;Therapeutic exercise;Therapeutic activities;Patient/family education;Manual techniques;Dry needling;Passive range of motion     PT Next Visit Plan Continue with specific stretching and periscapular re-training to reduce anterior scapular tilt/protraction, continue with progressive resistance exercise for prime shoulder flexors/abductors and internal rotators. Progressive ROM with use of serratus activation/periscap re-training. UPDATE HEP NEXT VISIT.    PT Home Exercise Plan See above    Consulted and Agree with Plan of Care Patient                   Patient will benefit from skilled therapeutic intervention in order to improve the following deficits and impairments:  Impaired UE functional use, Increased muscle spasms, Decreased activity tolerance, Decreased strength, Impaired sensation   Visit Diagnosis: Muscle weakness (generalized)    Chronic right shoulder pain      Consuela Mimes, PT, DPT #[id number]  Gertie Exon, PT 02/09/2023, 9:53 AM

## 2023-02-14 ENCOUNTER — Ambulatory Visit: Payer: Medicare Other | Admitting: Physical Therapy

## 2023-02-14 NOTE — Therapy (Deleted)
OUTPATIENT PHYSICAL THERAPY TREATMENT NOTE   Patient Name: Marissa George MRN: [id number] DOB:04/21/1951, 72 y.o., female Today's Date: 02/14/2023  END OF SESSION:      No past medical history on file. Past Surgical History:  Procedure Laterality Date   BREAST BIOPSY Left 2001   neg   BREAST BIOPSY Right    neg   There are no problems to display for this patient.    PCP: Antonieta Loveless, MD REFERRING PROVIDER: Katherina Right, MD  REFERRING DIAG: [phone] (ICD-10-CM) - Pain in right shoulder   THERAPY DIAG:  Muscle weakness (generalized)  Chronic right shoulder pain  Rationale for Evaluation and Treatment Rehabilitation  PERTINENT HISTORY: Pt reports inisious injury to Rt shoulder, initially unable to lift arm into ABD or flexion, but better able to move things, it just feels weak and retricted, unable to tuck in shirts behind back on Rt or reach back of neck on Right   Pt is referred to OPPT for acute ongoing Rt shoulder pain that started ~6 months prior to evaluation (early Jan 2024). She was referred to orthopedics but her appointment canceled 3x. 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]      SUBJECTIVE:                                                                                                                                                                                      SUBJECTIVE STATEMENT:  Pt reports feeling generally well this AM. She reports using her R arm more to reach overhead and placing items in overhead cabinets. She is still challenged with moving Pyrex dish. Pt is compliant with HEP and continuing with personal training.    PAIN:  Are you having pain? Pt denies pain at rest, pain with resisted shoulder  elevation and overhead activity/reaching behind back    OBJECTIVE: (objective measures completed at initial evaluation unless otherwise dated)      [facility] PT Assessment - 01/17/23 0001                Assessment    Medical Diagnosis Rt shoulder pain     Referring Provider (PT) Katherina Right MD   Primary care physician    Onset Date/Surgical Date 08/16/22   estimated    Hand Dominance Right     Prior Therapy For low back at another clinic, finished in December 2023  Balance Screen    Has the patient fallen in the past 6 months No     Has the patient had a decrease in activity level because of a fear of falling?  Yes     Is the patient reluctant to leave their home because of a fear of falling?  No          Prior Function    Level of Independence Independent;Independent with basic ADLs;Independent with gait;Independent with transfers;Independent with community mobility without device          Observation/Other Assessments    Focus on Therapeutic Outcomes (FOTO)  55          Sensation    Light Touch Impaired Detail     Light Touch Impaired Details Impaired RUE   has been followed for Rt hand numbness to volar digits 2, 3, and 4, no palmar sensory changes. 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, has been scheduled for NCS                A/ROM:  Rt shoulder flexion: 128 degrees,  Rt shoulder ABDCT: 78 degrees  Right shoulder ER: T1  Right shoulder IR: T6   P/ROM:  Rt shoulder flexion 138 degrees (end range pain) Rt shoulder ABDCT: 114 degrees (end range pain)  Rt shoulder ER 70 degrees (guarded but able to get to end-range)  Rt shoulder IR: 65   MMT:  Rt shoulder IR 5/5 painful  Rt shoulder ER 5/5 (just a little sore) Rt shoulder flexion (supine 0-90), ok Right shoulder flexion (standing) 5/5 bilat  Right infraspinatus MMT: 5/5 pain free Right elbow flexion 4+/5 (r/o biceps tendon) ok Right extension 5/5 ok   Palpation: -Anterior shoulder tenderness with pain when Rt  subscapularis tendon is exposed -lateral subscapularis palpation abnormal "feels like being poked with a needle"    Postural assessment:  -very mild anterior deviation of scapulae with mild tightness Rt calvicular pec major, Pec minor also tight, but both WNL.    Passive Accessory Motion (01/19/23) Glenohumeral Joint: anterior glide: WNL, posterior glide: mild restriction, inferior glide: moderate restriction at 90 deg ABD       TREATMENT TODAY   Manual Therapy - for symptom modulation, soft tissue sensitivity and mobility, joint mobility, ROM   R shoulder PROM within pt tolerance x 3 minutes;   -Pt has full passive flexion and abduction with mild discomfort with end-range flexion; mild motion loss with ER at 90 deg ABD, IR WFL  Glenohumeral mobilization, inferior; gr III; 2 x 30 sec at 90 deg abduction and 140 deg abduction  GHJ A-P mobilization at gr II for pain control; 2 x 30 sec bouts  STM R anterior and middle deltoid  x 3 minutes;   *not today* With R shoulder in 120 deg abduction, STM of R subscapularis; x 2 minutes STM R anterior and middle deltoid, pectoralis major x 3 minutes; Trigger Point Dry Needling (TDN), unbilled Education performed with patient regarding potential benefits and mild adverse effects of TDN at previous visit.  Pt provided verbal consent to treatment. TDN performed to R middle deltoid with 0.25 x 40 single needle placements with local twitch response (LTR). Pistoning technique utilized. Improved pain-free motion following intervention.     Therapeutic Exercise - for improved soft tissue flexibility and extensibility as needed for ROM, isometrics for analgesic effect and to promote soft tissue healing, periscapular re-training for scapular positioning and scapulothoracic mechanics  Upper body ergometer, 2 minutes forward, 2 minutes  backward - for tissue warm-up to improve muscle performance, improved soft tissue mobility/extensibility - subjective  information gathered during this time  Wall Y with Green Tband; 2x10  Shoulder IR with Black Tband; 2x10  Standing scaption with 2-lb Dbells bilat UE; 2x10  -mirror feedback to attain scapular plane and maintain proper posture  Tband high row, Blue Tband; 2x10  PATIENT EDUCATION: Verbal and tactile cueing as well as PT demo for carryover of exercise technique. We discussed current progress and prognosis c PT.     *not today* Serratus slide with foam roller at wall 2x8, with Red Tband at wrists Prone W; 2x10, 3 sec hold Shoulder ADD with Green Tband, ABD AAROM; x20 Horizontal abduction in doorway with elbow flexed 90 deg; 2x10 with scap retraction Wall angel; attempted, difficulty maintaining ER and discomfort in anterior shoulder Prone T; 2x10, 3 sec hold  -tactile and verbal cueing for scapular retraction/depression Pec stretch in doorway, shoulders at 90 deg abduction; 2 x 30 sec Shoulder IR isometric at door frame, x 10, 5 sec isometric hold     HOME EXERCISE PROGRAM Per Alvera Novel, DPT at initial eval -seated Rt shoulder adduction isometrics with towel roll 15x5secH  -seated blueTB row with endrange scapular retraction 1x10x3secH   Access Code: [email or web address] URL: [email or web address] Date: 01/19/2023 Prepared by: Consuela Mimes  Exercises - Standing Isometric Shoulder Internal Rotation at Doorway  - 2 x daily - 7 x weekly - 2 sets - 10 reps - 5 sec hold - Doorway Pec Stretch at 90 Degrees Abduction  - 2 x daily - 7 x weekly - 3 sets - 30sec hold       PT Short Term Goals -                PT SHORT TERM GOAL #1    Title Pt to reports tolerance and compliance to HEP assignment.     Baseline Eval: baseline HEP initiated.     Time 2     Period Weeks     Status New     Target Date 01/31/23          PT SHORT TERM GOAL #2    Title Pt to report improved use of RUE in ADL and household activity, less pain, less perceived weakness.     Baseline Eval:  compensating with LUE, arm feels weak when lifting heavy kitchen items.    Time 3     Period Weeks     Status New     Target Date 02/07/23                       PT Long Term Goals                PT LONG TERM GOAL #1    Title Pt to report less functional disability of RUE AEB score on FOTO survey score improvent >12.     Baseline eval: 55     Time 8     Period Weeks     Status New     Target Date 03/14/23          PT LONG TERM GOAL #2    Title Pt to demonstrate 5/5 and painfree MMT RUE shoulder flexion, ABD, IR, ER to indicated improved funcitonal load tolerance.     Baseline Pain with most of these at eval, most pain with resisted IR and deep palpation of subscapularis tendon.     Time 8  Period Weeks     Status New     Target Date 03/14/23          PT LONG TERM GOAL #3    Title Pt to report beginning to integrate overhead lifts as desired to personal training session without exacerbation of pain.     Baseline eval: avoiding overhead lifts with trainer     Time 8     Period Weeks     Status New     Target Date 03/14/23                       Plan -       Clinical Impression Statement Patient exhibits normalizing shoulder elevation AROM and she is able to access full overhead range with lower trap activation drill (wall Y with Tband). We are able to initiate standing shoulder elevation (utilized scaption) with resistance without notable onset of pain, only noticeable difference in perceived load between R and LUE. Patient has remaining deficits in shoulder AROM in flexion/abduction with minimal PROM motion deficits, mobility, postural changes/anteriorly tilted and protracted scapulae, taut/tender subscap/deltoid/pec, and local nociceptive anterior shoulder pain; pt has comorbid R upper limb paresthesias (awaiting NCV study). Patient will benefit from continued skilled therapeutic intervention to address the above deficits as needed for improved function and QoL.       Personal Factors and Comorbidities Fitness;Behavior Pattern;Time since onset of injury/illness/exacerbation     Examination-Activity Limitations Reach Overhead;Bathing;Carry;Dressing;Hygiene/Grooming     Examination-Participation Restrictions Meal Prep;Cleaning;Community Activity;Laundry;Yard Work     Stability/Clinical Decision Making Stable/Uncomplicated     Clinical Decision Making Moderate     Rehab Potential Excellent     PT Frequency 2x / week     PT Duration 8 weeks     PT Treatment/Interventions Electrical Stimulation;Moist Heat;Functional mobility training;Therapeutic exercise;Therapeutic activities;Patient/family education;Manual techniques;Dry needling;Passive range of motion     PT Next Visit Plan Continue with specific stretching and periscapular re-training to reduce anterior scapular tilt/protraction, continue with progressive resistance exercise for prime shoulder flexors/abductors and internal rotators. Progressive ROM with use of serratus activation/periscap re-training. UPDATE HEP NEXT VISIT.    PT Home Exercise Plan See above    Consulted and Agree with Plan of Care Patient                   Patient will benefit from skilled therapeutic intervention in order to improve the following deficits and impairments:  Impaired UE functional use, Increased muscle spasms, Decreased activity tolerance, Decreased strength, Impaired sensation   Visit Diagnosis: Muscle weakness (generalized)   Chronic right shoulder pain      Consuela Mimes, PT, DPT #[id number]  Gertie Exon, PT 02/14/2023, 5:36 AM

## 2023-02-16 ENCOUNTER — Encounter: Payer: Medicare Other | Admitting: Physical Therapy

## 2023-02-21 ENCOUNTER — Ambulatory Visit: Payer: Medicare Other | Attending: Family Medicine | Admitting: Physical Therapy

## 2023-02-21 ENCOUNTER — Encounter: Payer: Self-pay | Admitting: Physical Therapy

## 2023-02-21 DIAGNOSIS — M25511 Pain in right shoulder: Secondary | ICD-10-CM | POA: Diagnosis present

## 2023-02-21 DIAGNOSIS — M6281 Muscle weakness (generalized): Secondary | ICD-10-CM | POA: Diagnosis present

## 2023-02-21 DIAGNOSIS — G8929 Other chronic pain: Secondary | ICD-10-CM | POA: Diagnosis present

## 2023-02-21 NOTE — Therapy (Signed)
OUTPATIENT PHYSICAL THERAPY TREATMENT NOTE   Patient Name: Marissa George MRN: [id number] DOB:27-Sep-1950, 72 y.o., female Today's Date: 02/21/2023  END OF SESSION:   PT End of Session - 02/21/23 0907     Visit Number 8    Number of Visits 16    Date for PT Re-Evaluation 03/14/23    Authorization Type Medicare A & B    Authorization Time Period 01/17/23-03/14/23    Progress Note Due on Visit 10    PT Start Time 0902    PT Stop Time 0944    PT Time Calculation (min) 42 min    Activity Tolerance Patient tolerated treatment well;No increased pain    Behavior During Therapy [facility] for tasks assessed/performed               History reviewed. No pertinent past medical history. Past Surgical History:  Procedure Laterality Date   BREAST BIOPSY Left 2001   neg   BREAST BIOPSY Right    neg   There are no problems to display for this patient.    PCP: Antonieta Loveless, MD REFERRING PROVIDER: Katherina Right, MD  REFERRING DIAG: [phone] (ICD-10-CM) - Pain in right shoulder   THERAPY DIAG:  Muscle weakness (generalized)  Chronic right shoulder pain  Rationale for Evaluation and Treatment Rehabilitation  PERTINENT HISTORY: Pt reports inisious injury to Rt shoulder, initially unable to lift arm into ABD or flexion, but better able to move things, it just feels weak and retricted, unable to tuck in shirts behind back on Rt or reach back of neck on Right   Pt is referred to OPPT for acute ongoing Rt shoulder pain that started ~6 months prior to evaluation (early Jan 2024). She was referred to orthopedics but her appointment canceled 3x. 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]      SUBJECTIVE:                                                                                                                                                                                       SUBJECTIVE STATEMENT:  Pt reports no significant pain at arrival. 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 Patient reports compliance with her HEP.    PAIN:  Are you having pain? Pt denies pain at rest, pain with resisted shoulder elevation and overhead activity/reaching behind back    OBJECTIVE: (objective measures completed at initial evaluation  unless otherwise dated)      [facility] PT Assessment - 01/17/23 0001                Assessment    Medical Diagnosis Rt shoulder pain     Referring Provider (PT) Katherina Right MD   Primary care physician    Onset Date/Surgical Date 08/16/22   estimated    Hand Dominance Right     Prior Therapy For low back at another clinic, finished in December 2023          Balance Screen    Has the patient fallen in the past 6 months No     Has the patient had a decrease in activity level because of a fear of falling?  Yes     Is the patient reluctant to leave their home because of a fear of falling?  No          Prior Function    Level of Independence Independent;Independent with basic ADLs;Independent with gait;Independent with transfers;Independent with community mobility without device          Observation/Other Assessments    Focus on Therapeutic Outcomes (FOTO)  55          Sensation    Light Touch Impaired Detail     Light Touch Impaired Details Impaired RUE   has been followed for Rt hand numbness to volar digits 2, 3, and 4, no palmar sensory changes. 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, has been scheduled for NCS                A/ROM:  Rt shoulder flexion: 128 degrees,  Rt shoulder ABDCT: 78 degrees  Right shoulder ER: T1  Right shoulder IR: T6   P/ROM:  Rt shoulder flexion 138 degrees (end range pain) Rt shoulder ABDCT: 114 degrees (end range pain)  Rt shoulder ER 70 degrees  (guarded but able to get to end-range)  Rt shoulder IR: 65   MMT:  Rt shoulder IR 5/5 painful  Rt shoulder ER 5/5 (just a little sore) Rt shoulder flexion (supine 0-90), ok Right shoulder flexion (standing) 5/5 bilat  Right infraspinatus MMT: 5/5 pain free Right elbow flexion 4+/5 (r/o biceps tendon) ok Right extension 5/5 ok   Palpation: -Anterior shoulder tenderness with pain when Rt subscapularis tendon is exposed -lateral subscapularis palpation abnormal "feels like being poked with a needle"    Postural assessment:  -very mild anterior deviation of scapulae with mild tightness Rt calvicular pec major, Pec minor also tight, but both WNL.    Passive Accessory Motion (01/19/23) Glenohumeral Joint: anterior glide: WNL, posterior glide: mild restriction, inferior glide: moderate restriction at 90 deg ABD       TREATMENT TODAY   Manual Therapy - for symptom modulation, soft tissue sensitivity and mobility, joint mobility, ROM   R shoulder PROM within pt tolerance x 3 minutes;   -Pt has full passive flexion and abduction with mild discomfort with end-range flexion; minimal motion loss with ER and ABD  Glenohumeral mobilization, inferior; gr III; 2 x 30 sec at 90 deg abduction and 120 deg abduction  GHJ A-P mobilization at gr II for pain control; 1 x 30 sec bout  STM R anterior and middle deltoid  x 3 minutes;   *not today* With R shoulder in 120 deg abduction, STM of R subscapularis; x 2 minutes STM R anterior and middle deltoid, pectoralis major x 3 minutes; Trigger Point Dry Needling (TDN), unbilled Education performed with patient  regarding potential benefits and mild adverse effects of TDN at previous visit.  Pt provided verbal consent to treatment. TDN performed to R middle deltoid with 0.25 x 40 single needle placements with local twitch response (LTR). Pistoning technique utilized. Improved pain-free motion following intervention.     Therapeutic Exercise - for  improved soft tissue flexibility and extensibility as needed for ROM, isometrics for analgesic effect and to promote soft tissue healing, periscapular re-training for scapular positioning and scapulothoracic mechanics  Upper body ergometer, 2 minutes forward, 2 minutes backward - for tissue warm-up to improve muscle performance, improved soft tissue mobility/extensibility - subjective information gathered during this time  Wall Y with Green Tband; 2x10  Shoulder IR with Black Tband; 2x10  Standing scaption with 2-lb Dbells bilat UE; 2x10  -mirror feedback to attain scapular plane and maintain proper posture  Tband high row, Blue Tband; 2x10  PATIENT EDUCATION: HEP update and review. Verbal and tactile cueing as well as PT demo for carryover of exercise technique. We discussed current progress and prognosis c PT.     *not today* Serratus slide with foam roller at wall 2x8, with Red Tband at wrists Prone W; 2x10, 3 sec hold Shoulder ADD with Green Tband, ABD AAROM; x20 Horizontal abduction in doorway with elbow flexed 90 deg; 2x10 with scap retraction Wall angel; attempted, difficulty maintaining ER and discomfort in anterior shoulder Prone T; 2x10, 3 sec hold  -tactile and verbal cueing for scapular retraction/depression Pec stretch in doorway, shoulders at 90 deg abduction; 2 x 30 sec Shoulder IR isometric at door frame, x 10, 5 sec isometric hold     HOME EXERCISE PROGRAM Per Alvera Novel, DPT at initial eval -seated Rt shoulder adduction isometrics with towel roll 15x5secH  -seated blueTB row with endrange scapular retraction 1x10x3secH   Access Code: [email or web address] URL: [email or web address] Date: 01/19/2023 Prepared by: Consuela Mimes  Exercises - Standing Isometric Shoulder Internal Rotation at Doorway  - 2 x daily - 7 x weekly - 2 sets - 10 reps - 5 sec hold - Doorway Pec Stretch at 90 Degrees Abduction  - 2 x daily - 7 x weekly - 3 sets - 30sec hold        PT Short Term Goals -                PT SHORT TERM GOAL #1    Title Pt to reports tolerance and compliance to HEP assignment.     Baseline Eval: baseline HEP initiated.     Time 2     Period Weeks     Status New     Target Date 01/31/23          PT SHORT TERM GOAL #2    Title Pt to report improved use of RUE in ADL and household activity, less pain, less perceived weakness.     Baseline Eval: compensating with LUE, arm feels weak when lifting heavy kitchen items.    Time 3     Period Weeks     Status New     Target Date 02/07/23                       PT Long Term Goals                PT LONG TERM GOAL #1    Title Pt to report less functional disability of RUE AEB score on FOTO survey score improvement >12.     Baseline  eval: 55     Time 8     Period Weeks     Status New     Target Date 03/14/23          PT LONG TERM GOAL #2    Title Pt to demonstrate 5/5 and painfree MMT RUE shoulder flexion, ABD, IR, ER to indicated improved funcitonal load tolerance.     Baseline Pain with most of these at eval, most pain with resisted IR and deep palpation of subscapularis tendon.     Time 8     Period Weeks     Status New     Target Date 03/14/23          PT LONG TERM GOAL #3    Title Pt to report beginning to integrate overhead lifts as desired to personal training session without exacerbation of pain.     Baseline eval: avoiding overhead lifts with trainer     Time 8     Period Weeks     Status New     Target Date 03/14/23                       Plan -       Clinical Impression Statement Patient demonstrates improving R shoulder ROM in all planes and she tolerates overhead drills well this morning. She has minimal pain today, but she does have more soreness in R shoulder following completion of lifting and overhead work at home. Pt needs further work on improving scapulothoracic mechanics and postural re-edu. Patient has remaining deficits in shoulder AROM in  flexion/abduction with minimal PROM motion deficits, mobility, postural changes/anteriorly tilted and protracted scapulae, taut/tender subscap/deltoid/pec, and local nociceptive anterior shoulder pain; pt has comorbid R upper limb paresthesias (awaiting NCV study). Patient will benefit from continued skilled therapeutic intervention to address the above deficits as needed for improved function and QoL.      Personal Factors and Comorbidities Fitness;Behavior Pattern;Time since onset of injury/illness/exacerbation     Examination-Activity Limitations Reach Overhead;Bathing;Carry;Dressing;Hygiene/Grooming     Examination-Participation Restrictions Meal Prep;Cleaning;Community Activity;Laundry;Yard Work     Stability/Clinical Decision Making Stable/Uncomplicated     Clinical Decision Making Moderate     Rehab Potential Excellent     PT Frequency 2x / week     PT Duration 8 weeks     PT Treatment/Interventions Electrical Stimulation;Moist Heat;Functional mobility training;Therapeutic exercise;Therapeutic activities;Patient/family education;Manual techniques;Dry needling;Passive range of motion     PT Next Visit Plan Continue with specific stretching and periscapular re-training to reduce anterior scapular tilt/protraction, continue with progressive resistance exercise for prime shoulder flexors/abductors and internal rotators. Progressive ROM with use of serratus activation/periscap re-training.    PT Home Exercise Plan See above    Consulted and Agree with Plan of Care Patient                   Patient will benefit from skilled therapeutic intervention in order to improve the following deficits and impairments:  Impaired UE functional use, Increased muscle spasms, Decreased activity tolerance, Decreased strength, Impaired sensation   Visit Diagnosis: Muscle weakness (generalized)   Chronic right shoulder pain      Consuela Mimes, PT, DPT #[id number]  Gertie Exon, PT 02/21/2023, 9:08  AM

## 2023-02-23 ENCOUNTER — Ambulatory Visit: Payer: Medicare Other | Admitting: Physical Therapy

## 2023-02-23 ENCOUNTER — Encounter: Payer: Self-pay | Admitting: Physical Therapy

## 2023-02-23 DIAGNOSIS — G8929 Other chronic pain: Secondary | ICD-10-CM

## 2023-02-23 DIAGNOSIS — M6281 Muscle weakness (generalized): Secondary | ICD-10-CM | POA: Diagnosis not present

## 2023-02-23 NOTE — Therapy (Addendum)
OUTPATIENT PHYSICAL THERAPY TREATMENT NOTE/GOAL UPDATE AND RE-CERT   Patient Name: Marissa George MRN: [id number] DOB:1951/01/25, 72 y.o., female Today's Date: 02/23/2023  END OF SESSION:   PT End of Session - 02/23/23 0915     Visit Number 9    Number of Visits 16    Date for PT Re-Evaluation 03/14/23    Authorization Type Medicare A & B    Authorization Time Period 01/17/23-03/14/23    Progress Note Due on Visit 10    PT Start Time 0900    PT Stop Time 0943    PT Time Calculation (min) 43 min    Activity Tolerance Patient tolerated treatment well;No increased pain    Behavior During Therapy [facility] for tasks assessed/performed              No past medical history on file. Past Surgical History:  Procedure Laterality Date   BREAST BIOPSY Left 2001   neg   BREAST BIOPSY Right    neg   There are no problems to display for this patient.    PCP: Antonieta Loveless, MD REFERRING PROVIDER: Katherina Right, MD  REFERRING DIAG: [phone] (ICD-10-CM) - Pain in right shoulder   THERAPY DIAG:  Muscle weakness (generalized)  Chronic right shoulder pain  Rationale for Evaluation and Treatment Rehabilitation  PERTINENT HISTORY: Pt reports inisious injury to Rt shoulder, initially unable to lift arm into ABD or flexion, but better able to move things, it just feels weak and retricted, unable to tuck in shirts behind back on Rt or reach back of neck on Right   Pt is referred to OPPT for acute ongoing Rt shoulder pain that started ~6 months prior to evaluation (early Jan 2024). She was referred to orthopedics but her appointment canceled 3x. 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]      SUBJECTIVE:                                                                                                                                                                                       SUBJECTIVE STATEMENT:  Pt reports no significant pain at arrival. 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 Patient reports 70% global rating of improvement. She feels her mobility and ability to perform reaching and work above shoulder height has improved. She is doing well recently with her personal training sessions. They have  not completed significant overhead pushing/pulling movements yet; pt has performed high row with band as she has in clinic. Patient reports difficulty with overhead activity and reaching; difficulty with lifting e.g. picking up pot in kitchen.    PAIN:  Are you having pain? Pt denies pain at rest, pain with resisted shoulder elevation and overhead activity/reaching behind back     OBJECTIVE: (objective measures completed at initial evaluation unless otherwise dated)   A/ROM:  Rt shoulder flexion: 128 degrees,  Rt shoulder ABDCT: 78 degrees  Right shoulder ER: T1  Right shoulder IR: T6   P/ROM:  Rt shoulder flexion 138 degrees (end range pain) Rt shoulder ABDCT: 114 degrees (end range pain)  Rt shoulder ER 70 degrees (guarded but able to get to end-range)  Rt shoulder IR: 65   MMT:  Rt shoulder IR 5/5 painful  Rt shoulder ER 5/5 (just a little sore) Rt shoulder flexion (supine 0-90), ok Right shoulder flexion (standing) 5/5 bilat  Right infraspinatus MMT: 5/5 pain free Right elbow flexion 4+/5 (r/o biceps tendon) ok Right extension 5/5 ok  02/23/23:  Rt shoulder IR 5/5 no significant pain  Rt shoulder ER 5/5 no significant pain Rt shoulder flexion: 4/5 (difficult, no c/o pain) Rt shoulder abduction: 4/5* (pain)  Right elbow flexion 5/5 (denies pain)    Palpation: -Anterior shoulder tenderness with pain when Rt subscapularis tendon is exposed -lateral subscapularis  palpation abnormal "feels like being poked with a needle"    Postural assessment:  -very mild anterior deviation of scapulae with mild tightness Rt calvicular pec major, Pec minor also tight, but both WNL.    Passive Accessory Motion (01/19/23) Glenohumeral Joint: anterior glide: WNL, posterior glide: mild restriction, inferior glide: moderate restriction at 90 deg ABD       TREATMENT TODAY   Manual Therapy - for symptom modulation, soft tissue sensitivity and mobility, joint mobility, ROM   R shoulder PROM within pt tolerance x 3 minutes;   -Pt has full passive flexion with mild discomfort with end-range flexion; moderate shoulder complex abduction motion loss; minimal motion loss with ER and ABD  Glenohumeral mobilization, inferior; gr III; 2 x 30 sec at 90 deg abduction and 120 deg abduction   STM R anterior and middle deltoid  x 3 minutes;   *not today* GHJ A-P mobilization at gr II for pain control; 1 x 30 sec bout With R shoulder in 120 deg abduction, STM of R subscapularis; x 2 minutes STM R anterior and middle deltoid, pectoralis major x 3 minutes; Trigger Point Dry Needling (TDN), unbilled Education performed with patient regarding potential benefits and mild adverse effects of TDN at previous visit.  Pt provided verbal consent to treatment. TDN performed to R middle deltoid with 0.25 x 40 single needle placements with local twitch response (LTR). Pistoning technique utilized. Improved pain-free motion following intervention.     Therapeutic Exercise - for improved soft tissue flexibility and extensibility as needed for ROM, isometrics for analgesic effect and to promote soft tissue healing, periscapular re-training for scapular positioning and scapulothoracic mechanics  Upper body ergometer, 2 minutes forward, 2 minutes backward - for tissue warm-up to improve muscle performance, improved soft tissue mobility/extensibility - subjective information gathered during this  time   *GOAL UPDATE PERFORMED   Standing scaption with 3-lb Dbells bilat UE; 2x8  -mirror feedback to attain scapular plane and maintain proper posture  Tband high row, Blue Tband; 2x10 Shoulder ADD with Green Tband, ABD AAROM; x20  Shoulder abduction  isometric, elbow at door frame; x10, 5 sec hold    PATIENT EDUCATION: HEP update and review. Reviewed goals for PT and current progress made. We discussed continued plan of care and anticipated discharge time.     *not today* Wall Y with Green Tband; 2x10 Shoulder IR with Black Tband; 2x10 Serratus slide with foam roller at wall 2x8, with Red Tband at wrists Prone W; 2x10, 3 sec hold Horizontal abduction in doorway with elbow flexed 90 deg; 2x10 with scap retraction Wall angel; attempted, difficulty maintaining ER and discomfort in anterior shoulder Prone T; 2x10, 3 sec hold  -tactile and verbal cueing for scapular retraction/depression Pec stretch in doorway, shoulders at 90 deg abduction; 2 x 30 sec Shoulder IR isometric at door frame, x 10, 5 sec isometric hold     HOME EXERCISE PROGRAM Per Alvera Novel, DPT at initial eval -seated blueTB row with endrange scapular retraction 1x10x3secH   Access Code: [email or web address] URL: [email or web address] Date: 02/23/2023 Prepared by: Consuela Mimes  Exercises - Doorway Pec Stretch at 90 Degrees Abduction  - 2 x daily - 7 x weekly - 3 sets - 30sec hold - Shoulder Internal Rotation with Resistance  - 1 x daily - 7 x weekly - 2 sets - 10 reps - Prone W Scapular Retraction  - 1 x daily - 7 x weekly - 2 sets - 10 reps - Standing Low Trap Setting with Resistance at Wall  - 1 x daily - 7 x weekly - 2 sets - 10 reps - Standing Isometric Shoulder Abduction with Doorway - Arm Bent  - 2 x daily - 7 x weekly - 2 sets - 10 reps - 5sec hold       PT Short Term Goals -                PT SHORT TERM GOAL #1    Title Pt to reports tolerance and compliance to HEP assignment.      Baseline Eval: baseline HEP initiated.  02/23/23: Good compliance with HEP.     Time 2     Period Weeks     Status ACHIEVED    Target Date 01/31/23          PT SHORT TERM GOAL #2    Title Pt to report improved use of RUE in ADL and household activity, less pain, less perceived weakness.     Baseline Eval: compensating with LUE, arm feels weak when lifting heavy kitchen items. 02/23/23: Remaining difficulty with lifting heavy pot, overhead reaching    Time 3     Period Weeks     Status ON-GOING    Target Date 02/07/23                       PT Long Term Goals                PT LONG TERM GOAL #1    Title Pt to report less functional disability of RUE AEB score on FOTO survey score improvement >12.     Baseline eval: 50 02/23/23: 56/66    Time 8     Period Weeks     Status IN PROGRESS    Target Date 03/14/23          PT LONG TERM GOAL #2    Title Pt to demonstrate 5/5 and painfree MMT RUE shoulder flexion, ABD, IR, ER to indicated improved funcitonal load tolerance.     Baseline Pain with  most of these at eval, most pain with resisted IR and deep palpation of subscapularis tendon.  02/23/23: Pain with resisted R shoulder abduction only, shoulder flexion strength 4/5    Time 8     Period Weeks     Status IN PROGRESS    Target Date 03/14/23          PT LONG TERM GOAL #3    Title Pt to report beginning to integrate overhead lifts as desired to personal training session without exacerbation of pain.     Baseline eval: avoiding overhead lifts with trainer  02/23/23: foam roller serratus slide overhead only, resistance bands overhead pulldown. No recent overhead pressing/pushing.    Time 8     Period Weeks     Status ON-GOING    Target Date 03/14/23                       Plan -       Clinical Impression Statement Patient demonstrates improving R shoulder AROM and PROM, and she has been able to progress with PRE including resisted shoulder forward elevation. Pt has  performed front and lateral raises with her personal trainer without significant flare-up or issue. FOTO score has improved, but pt has not yet met long-term goal. Pt tolerates most household tasks and work on her farm/garden well, but she does report limitation with overhead reaching and lifting heavier items out in front. Pt has minimal pain with most MMTs, though she has moderate weakness with long-lever shoulder flexion and with abduction; she has remaining pain with resisted abduction. Pt has made progress to date, but has not yet met PT goals. Patient will benefit from continued skilled therapeutic intervention to address the above deficits as needed for improved function and QoL.      Personal Factors and Comorbidities Fitness;Behavior Pattern;Time since onset of injury/illness/exacerbation     Examination-Activity Limitations Reach Overhead;Bathing;Carry;Dressing;Hygiene/Grooming     Examination-Participation Restrictions Meal Prep;Cleaning;Community Activity;Laundry;Yard Work     Stability/Clinical Decision Making Stable/Uncomplicated     Clinical Decision Making Moderate     Rehab Potential Excellent     PT Frequency 2x / week     PT Duration 8 weeks     PT Treatment/Interventions Electrical Stimulation;Moist Heat;Functional mobility training;Therapeutic exercise;Therapeutic activities;Patient/family education;Manual techniques;Dry needling;Passive range of motion     PT Next Visit Plan Continue with specific stretching and periscapular re-training to reduce anterior scapular tilt/protraction, continue with progressive resistance exercise for prime shoulder flexors/abductors and internal rotators. Graded introduction of overhead compound exercises e.g. pulldowns, overhead pressing.    PT Home Exercise Plan See above    Consulted and Agree with Plan of Care Patient                   Consuela Mimes, PT, DPT #[id number]  Gertie Exon, PT 02/23/2023, 9:15 AM

## 2023-02-23 NOTE — Addendum Note (Signed)
Addended by: Consuela Mimes T on: 02/23/2023 11:14 AM   Modules accepted: Orders

## 2023-02-28 ENCOUNTER — Ambulatory Visit: Payer: Medicare Other | Admitting: Physical Therapy

## 2023-02-28 NOTE — Therapy (Deleted)
OUTPATIENT PHYSICAL THERAPY TREATMENT NOTE   Patient Name: Marissa George MRN: [id number] DOB:[birth year], 72 y.o., female Today's Date: 02/28/2023  END OF SESSION:      No past medical history on file. Past Surgical History:  Procedure Laterality Date   BREAST BIOPSY Left 2001   neg   BREAST BIOPSY Right    neg   There are no problems to display for this patient.    PCP: Antonieta Loveless, MD REFERRING PROVIDER: Katherina Right, MD  REFERRING DIAG: [phone] (ICD-10-CM) - Pain in right shoulder   THERAPY DIAG:  Muscle weakness (generalized)  Chronic right shoulder pain  Rationale for Evaluation and Treatment Rehabilitation  PERTINENT HISTORY: Pt reports inisious injury to Rt shoulder, initially unable to lift arm into ABD or flexion, but better able to move things, it just feels weak and retricted, unable to tuck in shirts behind back on Rt or reach back of neck on Right   Pt is referred to OPPT for acute ongoing Rt shoulder pain that started ~6 months prior to evaluation (early Jan 2024). She was referred to orthopedics but her appointment canceled 3x. 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]      SUBJECTIVE:                                                                                                                                                                                      SUBJECTIVE STATEMENT:  Pt reports no significant pain at arrival. 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 Patient reports 70% global rating of improvement. She feels her mobility and ability to perform reaching and work above shoulder height has improved. She is doing well recently with her personal  training sessions. They have not completed significant overhead pushing/pulling movements yet; pt has performed high row with band as she has in clinic. Patient reports difficulty with overhead activity and reaching; difficulty with lifting e.g. picking up pot in kitchen.    PAIN:  Are you having pain? Pt denies pain at rest, pain with resisted shoulder elevation and overhead activity/reaching behind back     OBJECTIVE: (objective measures completed at initial evaluation unless otherwise dated)   A/ROM:  Rt shoulder flexion: 128 degrees,  Rt shoulder ABDCT: 78 degrees  Right shoulder ER: T1  Right shoulder IR: T6   P/ROM:  Rt shoulder flexion 138 degrees (end range pain)  Rt shoulder ABDCT: 114 degrees (end range pain)  Rt shoulder ER 70 degrees (guarded but able to get to end-range)  Rt shoulder IR: 65   MMT:  Rt shoulder IR 5/5 painful  Rt shoulder ER 5/5 (just a little sore) Rt shoulder flexion (supine 0-90), ok Right shoulder flexion (standing) 5/5 bilat  Right infraspinatus MMT: 5/5 pain free Right elbow flexion 4+/5 (r/o biceps tendon) ok Right extension 5/5 ok  02/23/23:  Rt shoulder IR 5/5 no significant pain  Rt shoulder ER 5/5 no significant pain Rt shoulder flexion: 4/5 (difficult, no c/o pain) Rt shoulder abduction: 4/5* (pain)  Right elbow flexion 5/5 (denies pain)    Palpation: -Anterior shoulder tenderness with pain when Rt subscapularis tendon is exposed -lateral subscapularis palpation abnormal "feels like being poked with a needle"    Postural assessment:  -very mild anterior deviation of scapulae with mild tightness Rt calvicular pec major, Pec minor also tight, but both WNL.    Passive Accessory Motion (01/19/23) Glenohumeral Joint: anterior glide: WNL, posterior glide: mild restriction, inferior glide: moderate restriction at 90 deg ABD       TREATMENT TODAY   Manual Therapy - for symptom modulation, soft tissue sensitivity and mobility,  joint mobility, ROM   R shoulder PROM within pt tolerance x 3 minutes;   -Pt has full passive flexion with mild discomfort with end-range flexion; moderate shoulder complex abduction motion loss; minimal motion loss with ER and ABD  Glenohumeral mobilization, inferior; gr III; 2 x 30 sec at 90 deg abduction and 120 deg abduction   STM R anterior and middle deltoid  x 3 minutes;   *not today* GHJ A-P mobilization at gr II for pain control; 1 x 30 sec bout With R shoulder in 120 deg abduction, STM of R subscapularis; x 2 minutes STM R anterior and middle deltoid, pectoralis major x 3 minutes; Trigger Point Dry Needling (TDN), unbilled Education performed with patient regarding potential benefits and mild adverse effects of TDN at previous visit.  Pt provided verbal consent to treatment. TDN performed to R middle deltoid with 0.25 x 40 single needle placements with local twitch response (LTR). Pistoning technique utilized. Improved pain-free motion following intervention.     Therapeutic Exercise - for improved soft tissue flexibility and extensibility as needed for ROM, isometrics for analgesic effect and to promote soft tissue healing, periscapular re-training for scapular positioning and scapulothoracic mechanics  Upper body ergometer, 2 minutes forward, 2 minutes backward - for tissue warm-up to improve muscle performance, improved soft tissue mobility/extensibility - subjective information gathered during this time   *GOAL UPDATE PERFORMED   Standing scaption with 3-lb Dbells bilat UE; 2x8  -mirror feedback to attain scapular plane and maintain proper posture  Tband high row, Blue Tband; 2x10 Shoulder ADD with Green Tband, ABD AAROM; x20  Shoulder abduction isometric, elbow at door frame; x10, 5 sec hold    PATIENT EDUCATION: HEP update and review. Reviewed goals for PT and current progress made. We discussed continued plan of care and anticipated discharge time.     *not  today* Wall Y with Green Tband; 2x10 Shoulder IR with Black Tband; 2x10 Serratus slide with foam roller at wall 2x8, with Red Tband at wrists Prone W; 2x10, 3 sec hold Horizontal abduction in doorway with elbow flexed 90 deg; 2x10 with scap retraction Wall angel; attempted, difficulty maintaining ER and discomfort in anterior shoulder Prone T; 2x10, 3 sec hold  -tactile and verbal cueing for scapular retraction/depression  Pec stretch in doorway, shoulders at 90 deg abduction; 2 x 30 sec Shoulder IR isometric at door frame, x 10, 5 sec isometric hold     HOME EXERCISE PROGRAM Per Alvera Novel, DPT at initial eval -seated blueTB row with endrange scapular retraction 1x10x3secH   Access Code: [email or web address] URL: [email or web address] Date: 02/23/2023 Prepared by: Consuela Mimes  Exercises - Doorway Pec Stretch at 90 Degrees Abduction  - 2 x daily - 7 x weekly - 3 sets - 30sec hold - Shoulder Internal Rotation with Resistance  - 1 x daily - 7 x weekly - 2 sets - 10 reps - Prone W Scapular Retraction  - 1 x daily - 7 x weekly - 2 sets - 10 reps - Standing Low Trap Setting with Resistance at Wall  - 1 x daily - 7 x weekly - 2 sets - 10 reps - Standing Isometric Shoulder Abduction with Doorway - Arm Bent  - 2 x daily - 7 x weekly - 2 sets - 10 reps - 5sec hold       PT Short Term Goals -                PT SHORT TERM GOAL #1    Title Pt to reports tolerance and compliance to HEP assignment.     Baseline Eval: baseline HEP initiated.  02/23/23: Good compliance with HEP.     Time 2     Period Weeks     Status ACHIEVED    Target Date 01/31/23          PT SHORT TERM GOAL #2    Title Pt to report improved use of RUE in ADL and household activity, less pain, less perceived weakness.     Baseline Eval: compensating with LUE, arm feels weak when lifting heavy kitchen items. 02/23/23: Remaining difficulty with lifting heavy pot, overhead reaching    Time 3     Period Weeks      Status ON-GOING    Target Date 02/07/23                       PT Long Term Goals                PT LONG TERM GOAL #1    Title Pt to report less functional disability of RUE AEB score on FOTO survey score improvement >12.     Baseline eval: 50 02/23/23: 56/66    Time 8     Period Weeks     Status IN PROGRESS    Target Date 03/14/23          PT LONG TERM GOAL #2    Title Pt to demonstrate 5/5 and painfree MMT RUE shoulder flexion, ABD, IR, ER to indicated improved funcitonal load tolerance.     Baseline Pain with most of these at eval, most pain with resisted IR and deep palpation of subscapularis tendon.  02/23/23: Pain with resisted R shoulder abduction only, shoulder flexion strength 4/5    Time 8     Period Weeks     Status IN PROGRESS    Target Date 03/14/23          PT LONG TERM GOAL #3    Title Pt to report beginning to integrate overhead lifts as desired to personal training session without exacerbation of pain.     Baseline eval: avoiding overhead lifts with trainer  02/23/23: foam roller serratus slide overhead only, resistance bands overhead  pulldown. No recent overhead pressing/pushing.    Time 8     Period Weeks     Status ON-GOING    Target Date 03/14/23                       Plan -       Clinical Impression Statement Patient demonstrates improving R shoulder AROM and PROM, and she has been able to progress with PRE including resisted shoulder forward elevation. Pt has performed front and lateral raises with her personal trainer without significant flare-up or issue. FOTO score has improved, but pt has not yet met long-term goal. Pt tolerates most household tasks and work on her farm/garden well, but she does report limitation with overhead reaching and lifting heavier items out in front. Pt has minimal pain with most MMTs, though she has moderate weakness with long-lever shoulder flexion and with abduction; she has remaining pain with resisted  abduction. Pt has made progress to date, but has not yet met PT goals. Patient will benefit from continued skilled therapeutic intervention to address the above deficits as needed for improved function and QoL.      Personal Factors and Comorbidities Fitness;Behavior Pattern;Time since onset of injury/illness/exacerbation     Examination-Activity Limitations Reach Overhead;Bathing;Carry;Dressing;Hygiene/Grooming     Examination-Participation Restrictions Meal Prep;Cleaning;Community Activity;Laundry;Yard Work     Stability/Clinical Decision Making Stable/Uncomplicated     Clinical Decision Making Moderate     Rehab Potential Excellent     PT Frequency 2x / week     PT Duration 8 weeks     PT Treatment/Interventions Electrical Stimulation;Moist Heat;Functional mobility training;Therapeutic exercise;Therapeutic activities;Patient/family education;Manual techniques;Dry needling;Passive range of motion     PT Next Visit Plan Continue with specific stretching and periscapular re-training to reduce anterior scapular tilt/protraction, continue with progressive resistance exercise for prime shoulder flexors/abductors and internal rotators. Graded introduction of overhead compound exercises e.g. pulldowns, overhead pressing.    PT Home Exercise Plan See above    Consulted and Agree with Plan of Care Patient                   Consuela Mimes, PT, DPT #[id number]  Gertie Exon, PT 02/28/2023, 12:44 PM

## 2023-03-02 ENCOUNTER — Encounter: Payer: Self-pay | Admitting: Physical Therapy

## 2023-03-02 ENCOUNTER — Ambulatory Visit: Payer: Medicare Other | Admitting: Physical Therapy

## 2023-03-02 DIAGNOSIS — M6281 Muscle weakness (generalized): Secondary | ICD-10-CM

## 2023-03-02 DIAGNOSIS — G8929 Other chronic pain: Secondary | ICD-10-CM

## 2023-03-02 NOTE — Therapy (Signed)
OUTPATIENT PHYSICAL THERAPY TREATMENT AND PROGRESS NOTE   Dates of reporting period  01/17/23   to   03/02/23    Patient Name: Marissa George MRN: [id number] DOB:05/05/1951, 72 y.o., female Today's Date: 03/02/2023  END OF SESSION:   PT End of Session - 03/02/23 1127     Visit Number 10    Number of Visits 16    Date for PT Re-Evaluation 03/14/23    Authorization Type Medicare A & B    Authorization Time Period 01/17/23-03/14/23    Progress Note Due on Visit 10    PT Start Time 1124    PT Stop Time 1203    PT Time Calculation (min) 39 min    Activity Tolerance Patient tolerated treatment well;No increased pain    Behavior During Therapy [facility] for tasks assessed/performed               History reviewed. No pertinent past medical history. Past Surgical History:  Procedure Laterality Date   BREAST BIOPSY Left 2001   neg   BREAST BIOPSY Right    neg   There are no problems to display for this patient.    PCP: Antonieta Loveless, MD REFERRING PROVIDER: Katherina Right, MD  REFERRING DIAG: [id number] (ICD-10-CM) - Pain in right shoulder   THERAPY DIAG:  Muscle weakness (generalized)  Chronic right shoulder pain  Rationale for Evaluation and Treatment Rehabilitation  PERTINENT HISTORY: Pt reports inisious injury to Rt shoulder, initially unable to lift arm into ABD or flexion, but better able to move things, it just feels weak and retricted, unable to tuck in shirts behind back on Rt or reach back of neck on Right   Pt is referred to OPPT for acute ongoing Rt shoulder pain that started ~6 months prior to evaluation (early Jan 2024). She was referred to orthopedics but her appointment canceled 3x. 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]       SUBJECTIVE:                                                                                                                                                                                      SUBJECTIVE STATEMENT:  Pt reports initiating overhead lifting in gym with 3-lb dumbbells and tolerating this well. She reports doing well after last session. She reports significant improvement to date. 70% global rating of improvement at last visit (goals were updated last appointment). No pain  at arrival. Pt reports some pain intermittently with overhead activity.    PAIN:  Are you having pain? Pt denies pain at rest, pain with resisted shoulder elevation and overhead activity/reaching behind back     OBJECTIVE: (objective measures completed at initial evaluation unless otherwise dated)   A/ROM:  Rt shoulder flexion: 128 degrees,  Rt shoulder ABDCT: 78 degrees  Right shoulder ER: T1  Right shoulder IR: T6   P/ROM:  Rt shoulder flexion 138 degrees (end range pain) Rt shoulder ABDCT: 114 degrees (end range pain)  Rt shoulder ER 70 degrees (guarded but able to get to end-range)  Rt shoulder IR: 65   MMT:  Rt shoulder IR 5/5 painful  Rt shoulder ER 5/5 (just a little sore) Rt shoulder flexion (supine 0-90), ok Right shoulder flexion (standing) 5/5 bilat  Right infraspinatus MMT: 5/5 pain free Right elbow flexion 4+/5 (r/o biceps tendon) ok Right extension 5/5 ok  02/23/23:  Rt shoulder IR 5/5 no significant pain  Rt shoulder ER 5/5 no significant pain Rt shoulder flexion: 4/5 (difficult, no c/o pain) Rt shoulder abduction: 4/5* (pain)  Right elbow flexion 5/5 (denies pain)   03/02/23:  Rt shoulder IR 5/5 no significant pain  Rt shoulder ER 5/5 no significant pain Rt shoulder flexion: 5-/5 (no c/o pain) Rt shoulder abduction: 4+/5* (pain)  Right elbow flexion 5/5 (denies pain)   Palpation: -Anterior shoulder tenderness with pain when Rt subscapularis tendon is  exposed -lateral subscapularis palpation abnormal "feels like being poked with a needle"    Postural assessment:  -very mild anterior deviation of scapulae with mild tightness Rt calvicular pec major, Pec minor also tight, but both WNL.    Passive Accessory Motion (01/19/23) Glenohumeral Joint: anterior glide: WNL, posterior glide: mild restriction, inferior glide: moderate restriction at 90 deg ABD       TREATMENT TODAY   Manual Therapy - for symptom modulation, soft tissue sensitivity and mobility, joint mobility, ROM   R shoulder PROM within pt tolerance x 3 minutes;   -Pt has full passive flexion with mild discomfort with end-range flexion; moderate shoulder complex abduction motion loss; minimal motion loss with ER and ABD  Glenohumeral mobilization, inferior; gr III; 2 x 30 sec at 90 deg abduction and 120 deg abduction     *not today* STM R anterior and middle deltoid  x 3 minutes; GHJ A-P mobilization at gr II for pain control; 1 x 30 sec bout With R shoulder in 120 deg abduction, STM of R subscapularis; x 2 minutes STM R anterior and middle deltoid, pectoralis major x 3 minutes; Trigger Point Dry Needling (TDN), unbilled Education performed with patient regarding potential benefits and mild adverse effects of TDN at previous visit.  Pt provided verbal consent to treatment. TDN performed to R middle deltoid with 0.25 x 40 single needle placements with local twitch response (LTR). Pistoning technique utilized. Improved pain-free motion following intervention.     Therapeutic Exercise - for improved soft tissue flexibility and extensibility as needed for ROM, isometrics for analgesic effect and to promote soft tissue healing, periscapular re-training for scapular positioning and scapulothoracic mechanics  Upper body ergometer, 2 minutes forward, 2 minutes backward - for tissue warm-up to improve muscle performance, improved soft tissue mobility/extensibility - subjective  information gathered during this time   *GOAL UPDATE PERFORMED  Nautilus lat pulldown; 40-lb; 2x10  Standing scaption with 3-lb Dbells bilat UE; 2x8  -mirror feedback to attain scapular plane and maintain proper posture  PATIENT EDUCATION: HEP update and review. We discussed current progress made. We discussed continued plan of care and anticipated discharge time.     *not today* Shoulder abduction isometric, elbow at door frame; x10, 5 sec hold  Tband high row, Blue Tband; 2x10 Shoulder ADD with [place] Si Tband, ABD AAROM; x20 Wall Y with Green Tband; 2x10 Shoulder IR with Black Tband; 2x10 Serratus slide with foam roller at wall 2x8, with Red Tband at wrists Prone W; 2x10, 3 sec hold Horizontal abduction in doorway with elbow flexed 90 deg; 2x10 with scap retraction Wall angel; attempted, difficulty maintaining ER and discomfort in anterior shoulder Prone T; 2x10, 3 sec hold  -tactile and verbal cueing for scapular retraction/depression Pec stretch in doorway, shoulders at 90 deg abduction; 2 x 30 sec Shoulder IR isometric at door frame, x 10, 5 sec isometric hold     HOME EXERCISE PROGRAM Per Alvera Novel, DPT at initial eval -seated blueTB row with endrange scapular retraction 1x10x3secH   Access Code: [email or web address] URL: [email or web address] Date: 02/23/2023 Prepared by: Consuela Mimes  Exercises - Doorway Pec Stretch at 90 Degrees Abduction  - 2 x daily - 7 x weekly - 3 sets - 30sec hold - Shoulder Internal Rotation with Resistance  - 1 x daily - 7 x weekly - 2 sets - 10 reps - Prone W Scapular Retraction  - 1 x daily - 7 x weekly - 2 sets - 10 reps - Standing Low Trap Setting with Resistance at Wall  - 1 x daily - 7 x weekly - 2 sets - 10 reps - Standing Isometric Shoulder Abduction with Doorway - Arm Bent  - 2 x daily - 7 x weekly - 2 sets - 10 reps - 5sec hold       PT Short Term Goals -                PT SHORT TERM GOAL #1    Title Pt to  reports tolerance and compliance to HEP assignment.     Baseline Eval: baseline HEP initiated.  02/23/23: Good compliance with HEP.     Time 2     Period Weeks     Status ACHIEVED    Target Date 01/31/23          PT SHORT TERM GOAL #2    Title Pt to report improved use of RUE in ADL and household activity, less pain, less perceived weakness.     Baseline Eval: compensating with LUE, arm feels weak when lifting heavy kitchen items. 02/23/23: Remaining difficulty with lifting heavy pot, overhead reaching 03/02/23: Difficulty with iron skillet, other items under 5 lbs or not an issue    Time 3     Period Weeks     Status MOSTLY MET     Target Date 02/07/23                       PT Long Term Goals                PT LONG TERM GOAL #1    Title Pt to report less functional disability of RUE AEB score on FOTO survey score improvement >12.     Baseline eval: 50 02/23/23: 56/66 03/02/23: 57/66     Time 8     Period Weeks     Status IN PROGRESS    Target Date 03/14/23          PT LONG TERM GOAL #2  Title Pt to demonstrate 5/5 and painfree MMT RUE shoulder flexion, ABD, IR, ER to indicated improved funcitonal load tolerance.     Baseline Pain with most of these at eval, most pain with resisted IR and deep palpation of subscapularis tendon.  02/23/23: Pain with resisted R shoulder abduction only, shoulder flexion strength 4/5 03/02/23: Pain and relative weakness with resisted abduction only    Time 8     Period Weeks     Status IN PROGRESS    Target Date 03/14/23          PT LONG TERM GOAL #3    Title Pt to report beginning to integrate overhead lifts as desired to personal training session without exacerbation of pain.     Baseline eval: avoiding overhead lifts with trainer  02/23/23: foam roller serratus slide overhead only, resistance bands overhead pulldown. No recent overhead pressing/pushing. 03/02/23: Pt initiating overhead pressing with low weight without reproduction of  symptoms or exacerbation    Time 8     Period Weeks     Status ACHIEVED    Target Date 03/14/23                       Plan -       Clinical Impression Statement Patient demonstrates notably improved shoulder ROM actively and passively. She reports tolerating household tasks with loads < 5 lbs well. She has strong and painless MMTs with exception of resisted abduction. Pt has improved FOTO, but she has not yet met long-term goal. She has initiated overhead lifts during her personal training sessions and reports no remarkable pain or issue with these. Pt is making excellent progress, and she will likely be able to complete PT plan of care in timeline established by evaluating therapist.  Patient will benefit from continued skilled therapeutic intervention to address the above deficits as needed for improved function and QoL.      Personal Factors and Comorbidities Fitness;Behavior Pattern;Time since onset of injury/illness/exacerbation     Examination-Activity Limitations Reach Overhead;Bathing;Carry;Dressing;Hygiene/Grooming     Examination-Participation Restrictions Meal Prep;Cleaning;Community Activity;Laundry;Yard Work     Stability/Clinical Decision Making Stable/Uncomplicated     Clinical Decision Making Moderate     Rehab Potential Excellent     PT Frequency 2x / week     PT Duration 2-3 weeks     PT Treatment/Interventions Electrical Stimulation;Moist Heat;Functional mobility training;Therapeutic exercise;Therapeutic activities;Patient/family education;Manual techniques;Dry needling;Passive range of motion     PT Next Visit Plan Continue with specific stretching and periscapular re-training to reduce anterior scapular tilt/protraction, continue with progressive resistance exercise for prime shoulder flexors/abductors and internal rotators. Graded introduction of overhead compound exercises e.g. pulldowns, overhead pressing; progress PRE as tolerated.    PT Home Exercise Plan See  above    Consulted and Agree with Plan of Care Patient                   Consuela Mimes, PT, DPT #[id number]  Gertie Exon, PT 03/02/2023, 12:06 PM

## 2023-03-07 ENCOUNTER — Ambulatory Visit: Payer: Medicare Other | Admitting: Physical Therapy

## 2023-03-07 DIAGNOSIS — M6281 Muscle weakness (generalized): Secondary | ICD-10-CM | POA: Diagnosis not present

## 2023-03-07 DIAGNOSIS — G8929 Other chronic pain: Secondary | ICD-10-CM

## 2023-03-07 NOTE — Therapy (Signed)
OUTPATIENT PHYSICAL THERAPY TREATMENT    Patient Name: Marissa George MRN: [id number] DOB:06/07/1951, 72 y.o., female Today's Date: 03/07/2023  END OF SESSION:   PT End of Session - 03/07/23 1325     Visit Number 11    Number of Visits 16    Date for PT Re-Evaluation 03/14/23    Authorization Type Medicare A & B    Authorization Time Period 01/17/23-03/14/23    Progress Note Due on Visit 10    PT Start Time 1326    PT Stop Time 1403    PT Time Calculation (min) 37 min    Activity Tolerance Patient tolerated treatment well;No increased pain    Behavior During Therapy [facility] for tasks assessed/performed             No past medical history on file. Past Surgical History:  Procedure Laterality Date   BREAST BIOPSY Left 2001   neg   BREAST BIOPSY Right    neg   There are no problems to display for this patient.   PCP: Antonieta Loveless, MD REFERRING PROVIDER: Katherina Right, MD  REFERRING DIAG: [phone] (ICD-10-CM) - Pain in right shoulder   THERAPY DIAG:  Muscle weakness (generalized)  Chronic right shoulder pain  Rationale for Evaluation and Treatment Rehabilitation  PERTINENT HISTORY: Pt reports inisious injury to Rt shoulder, initially unable to lift arm into ABD or flexion, but better able to move things, it just feels weak and retricted, unable to tuck in shirts behind back on Rt or reach back of neck on Right   Pt is referred to OPPT for acute ongoing Rt shoulder pain that started ~6 months prior to evaluation (early Jan 2024). She was referred to orthopedics but her appointment canceled 3x. 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]      SUBJECTIVE:                                                                                                                                                                                       SUBJECTIVE STATEMENT:  Pt reports no pain at arrival. She reports some soreness after working out today. She reports doing well with most household items/pots and pans in kitchen. Pt reports tolerating last visit well.    PAIN:  Are you having pain? Pt denies pain at rest, pain with resisted shoulder elevation and overhead activity/reaching behind back     OBJECTIVE: (objective measures  completed at initial evaluation unless otherwise dated)   A/ROM:  Rt shoulder flexion: 128 degrees,  Rt shoulder ABDCT: 78 degrees  Right shoulder ER: T1  Right shoulder IR: T6   P/ROM:  Rt shoulder flexion 138 degrees (end range pain) Rt shoulder ABDCT: 114 degrees (end range pain)  Rt shoulder ER 70 degrees (guarded but able to get to end-range)  Rt shoulder IR: 65   MMT:  Rt shoulder IR 5/5 painful  Rt shoulder ER 5/5 (just a little sore) Rt shoulder flexion (supine 0-90), ok Right shoulder flexion (standing) 5/5 bilat  Right infraspinatus MMT: 5/5 pain free Right elbow flexion 4+/5 (r/o biceps tendon) ok Right extension 5/5 ok  02/23/23:  Rt shoulder IR 5/5 no significant pain  Rt shoulder ER 5/5 no significant pain Rt shoulder flexion: 4/5 (difficult, no c/o pain) Rt shoulder abduction: 4/5* (pain)  Right elbow flexion 5/5 (denies pain)   03/02/23:  Rt shoulder IR 5/5 no significant pain  Rt shoulder ER 5/5 no significant pain Rt shoulder flexion: 5-/5 (no c/o pain) Rt shoulder abduction: 4+/5* (pain)  Right elbow flexion 5/5 (denies pain)   Palpation: -Anterior shoulder tenderness with pain when Rt subscapularis tendon is exposed -lateral subscapularis palpation abnormal "feels like being poked with a needle"    Postural assessment:  -very mild anterior deviation of scapulae with mild tightness Rt calvicular pec major, Pec minor also tight, but both WNL.    Passive Accessory  Motion (01/19/23) Glenohumeral Joint: anterior glide: WNL, posterior glide: mild restriction, inferior glide: moderate restriction at 90 deg ABD       TREATMENT TODAY   Manual Therapy - for symptom modulation, soft tissue sensitivity and mobility, joint mobility, ROM   R shoulder PROM within pt tolerance x 3 minutes;   -Pt has full passive flexion with mild discomfort with end-range flexion; moderate shoulder complex abduction motion loss; minimal motion loss with ER and ABD  Glenohumeral mobilization, inferior; gr III; 2 x 30 sec at 90 deg abduction and 120 deg abduction   Scapular mobilization; upward rotation and posterior tilt; x 3 minutes   *not today* STM R anterior and middle deltoid  x 3 minutes; GHJ A-P mobilization at gr II for pain control; 1 x 30 sec bout With R shoulder in 120 deg abduction, STM of R subscapularis; x 2 minutes STM R anterior and middle deltoid, pectoralis major x 3 minutes; Trigger Point Dry Needling (TDN), unbilled Education performed with patient regarding potential benefits and mild adverse effects of TDN at previous visit.  Pt provided verbal consent to treatment. TDN performed to R middle deltoid with 0.25 x 40 single needle placements with local twitch response (LTR). Pistoning technique utilized. Improved pain-free motion following intervention.     Therapeutic Exercise - for improved soft tissue flexibility and extensibility as needed for ROM, isometrics for analgesic effect and to promote soft tissue healing, periscapular re-training for scapular positioning and scapulothoracic mechanics  Upper body ergometer, 2 minutes forward, 2 minutes backward - for tissue warm-up to improve muscle performance, improved soft tissue mobility/extensibility - subjective information gathered during this time  Sidelying shoulder abduction, 2-lb Dbell; 2x10  Nautilus lat pulldown; 40-lb; 2x10  Standing scaption with 4-lb Dbells bilat UE; 2x10  -mirror feedback  to attain scapular plane and maintain proper posture  Wall ball stabilization; flexion and abduction; 4.4 lb ball; 2 x 30 reps CW/CCW per direction   PATIENT EDUCATION: HEP review; discussed current plan of care and outlook c  PT    *not today* Shoulder abduction isometric, elbow at door frame; x10, 5 sec hold  Tband high row, Blue Tband; 2x10 Shoulder ADD with [place] Si Tband, ABD AAROM; x20 Wall Y with Green Tband; 2x10 Shoulder IR with Black Tband; 2x10 Serratus slide with foam roller at wall 2x8, with Red Tband at wrists Prone W; 2x10, 3 sec hold Horizontal abduction in doorway with elbow flexed 90 deg; 2x10 with scap retraction Wall angel; attempted, difficulty maintaining ER and discomfort in anterior shoulder Prone T; 2x10, 3 sec hold  -tactile and verbal cueing for scapular retraction/depression Pec stretch in doorway, shoulders at 90 deg abduction; 2 x 30 sec Shoulder IR isometric at door frame, x 10, 5 sec isometric hold     HOME EXERCISE PROGRAM Per Alvera Novel, DPT at initial eval -seated blueTB row with endrange scapular retraction 1x10x3secH   Access Code: [email or web address] URL: [email or web address] Date: 02/23/2023 Prepared by: Consuela Mimes  Exercises - Doorway Pec Stretch at 90 Degrees Abduction  - 2 x daily - 7 x weekly - 3 sets - 30sec hold - Shoulder Internal Rotation with Resistance  - 1 x daily - 7 x weekly - 2 sets - 10 reps - Prone W Scapular Retraction  - 1 x daily - 7 x weekly - 2 sets - 10 reps - Standing Low Trap Setting with Resistance at Wall  - 1 x daily - 7 x weekly - 2 sets - 10 reps - Standing Isometric Shoulder Abduction with Doorway - Arm Bent  - 2 x daily - 7 x weekly - 2 sets - 10 reps - 5sec hold       PT Short Term Goals -                PT SHORT TERM GOAL #1    Title Pt to reports tolerance and compliance to HEP assignment.     Baseline Eval: baseline HEP initiated.  02/23/23: Good compliance with HEP.     Time 2      Period Weeks     Status ACHIEVED    Target Date 01/31/23          PT SHORT TERM GOAL #2    Title Pt to report improved use of RUE in ADL and household activity, less pain, less perceived weakness.     Baseline Eval: compensating with LUE, arm feels weak when lifting heavy kitchen items. 02/23/23: Remaining difficulty with lifting heavy pot, overhead reaching 03/02/23: Difficulty with iron skillet, other items under 5 lbs or not an issue    Time 3     Period Weeks     Status MOSTLY MET     Target Date 02/07/23                       PT Long Term Goals                PT LONG TERM GOAL #1    Title Pt to report less functional disability of RUE AEB score on FOTO survey score improvement >12.     Baseline eval: 50 02/23/23: 56/66 03/02/23: 57/66     Time 8     Period Weeks     Status IN PROGRESS    Target Date 03/14/23          PT LONG TERM GOAL #2    Title Pt to demonstrate 5/5 and painfree MMT RUE shoulder flexion, ABD, IR, ER to indicated improved funcitonal  load tolerance.     Baseline Pain with most of these at eval, most pain with resisted IR and deep palpation of subscapularis tendon.  02/23/23: Pain with resisted R shoulder abduction only, shoulder flexion strength 4/5 03/02/23: Pain and relative weakness with resisted abduction only    Time 8     Period Weeks     Status IN PROGRESS    Target Date 03/14/23          PT LONG TERM GOAL #3    Title Pt to report beginning to integrate overhead lifts as desired to personal training session without exacerbation of pain.     Baseline eval: avoiding overhead lifts with trainer  02/23/23: foam roller serratus slide overhead only, resistance bands overhead pulldown. No recent overhead pressing/pushing. 03/02/23: Pt initiating overhead pressing with low weight without reproduction of symptoms or exacerbation    Time 8     Period Weeks     Status ACHIEVED    Target Date 03/14/23                       Plan -       Clinical  Impression Statement Patient is able to move through full shoulder complex abduction with sidelying AAROM with Dbell (gravity-assisted). Pt reports some discomfort with end-range elevation remaining with lat pulldown exercise. Forward elevation AROM/AAROM is grossly WFL at this time. Patient will benefit from continued skilled therapeutic intervention to address the above deficits as needed for improved function and QoL.      Personal Factors and Comorbidities Fitness;Behavior Pattern;Time since onset of injury/illness/exacerbation     Examination-Activity Limitations Reach Overhead;Bathing;Carry;Dressing;Hygiene/Grooming     Examination-Participation Restrictions Meal Prep;Cleaning;Community Activity;Laundry;Yard Work     Stability/Clinical Decision Making Stable/Uncomplicated     Clinical Decision Making Moderate     Rehab Potential Excellent     PT Frequency 2x / week     PT Duration 2-3 weeks     PT Treatment/Interventions Electrical Stimulation;Moist Heat;Functional mobility training;Therapeutic exercise;Therapeutic activities;Patient/family education;Manual techniques;Dry needling;Passive range of motion     PT Next Visit Plan Continue with specific stretching and periscapular re-training to reduce anterior scapular tilt/protraction, continue with progressive resistance exercise for prime shoulder flexors/abductors and internal rotators. Graded introduction of overhead compound exercises e.g. pulldowns, overhead pressing; progress PRE as tolerated.    PT Home Exercise Plan See above    Consulted and Agree with Plan of Care Patient                   Consuela Mimes, PT, DPT #[id number]  Gertie Exon, PT 03/07/2023, 2:03 PM

## 2023-03-09 ENCOUNTER — Ambulatory Visit: Payer: Medicare Other | Admitting: Physical Therapy

## 2023-03-09 DIAGNOSIS — G8929 Other chronic pain: Secondary | ICD-10-CM

## 2023-03-09 DIAGNOSIS — M6281 Muscle weakness (generalized): Secondary | ICD-10-CM | POA: Diagnosis not present

## 2023-03-09 NOTE — Therapy (Signed)
OUTPATIENT PHYSICAL THERAPY TREATMENT    Patient Name: Marissa George MRN: [id number] DOB:1951/05/08, 72 y.o., female Today's Date: 03/09/2023  END OF SESSION:   PT End of Session - 03/09/23 0729     Visit Number 12    Number of Visits 16    Date for PT Re-Evaluation 03/25/23    Authorization Type Medicare A & B    Authorization Time Period 01/17/23-03/14/23    Progress Note Due on Visit 10    PT Start Time 0730    PT Stop Time 0819    PT Time Calculation (min) 49 min    Activity Tolerance Patient tolerated treatment well;No increased pain    Behavior During Therapy [facility] for tasks assessed/performed             No past medical history on file. Past Surgical History:  Procedure Laterality Date   BREAST BIOPSY Left 2001   neg   BREAST BIOPSY Right    neg   There are no problems to display for this patient.   PCP: Antonieta Loveless, MD REFERRING PROVIDER: Katherina Right, MD  REFERRING DIAG: [phone] (ICD-10-CM) - Pain in right shoulder   THERAPY DIAG:  Muscle weakness (generalized)  Chronic right shoulder pain  Rationale for Evaluation and Treatment Rehabilitation  PERTINENT HISTORY: Pt reports inisious injury to Rt shoulder, initially unable to lift arm into ABD or flexion, but better able to move things, it just feels weak and retricted, unable to tuck in shirts behind back on Rt or reach back of neck on Right   Pt is referred to OPPT for acute ongoing Rt shoulder pain that started ~6 months prior to evaluation (early Jan 2024). She was referred to orthopedics but her appointment canceled 3x. 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 Patient reports some fatigue after last visit, but no other complaints. Patient reports compliance with her HEP. Patient reports having to move case of water and having pain with this.   PAIN:  Are you having pain? Pt denies pain at rest, pain with resisted shoulder elevation and overhead activity/reaching  behind back     OBJECTIVE: (objective measures completed at initial evaluation unless otherwise dated)   A/ROM:  Rt shoulder flexion: 128 degrees,  Rt shoulder ABDCT: 78 degrees  Right shoulder ER: T1  Right shoulder IR: T6   P/ROM:  Rt shoulder flexion 138 degrees (end range pain) Rt shoulder ABDCT: 114 degrees (end range pain)  Rt shoulder ER 70 degrees (guarded but able to get to end-range)  Rt shoulder IR: 65   MMT:  Rt shoulder IR 5/5 painful  Rt shoulder ER 5/5 (just a little sore) Rt shoulder flexion (supine 0-90), ok Right shoulder flexion (standing) 5/5 bilat  Right infraspinatus MMT: 5/5 pain free Right elbow flexion 4+/5 (r/o biceps tendon) ok Right extension 5/5 ok  02/23/23:  Rt shoulder IR 5/5 no significant pain  Rt shoulder ER 5/5 no significant pain Rt shoulder flexion: 4/5 (difficult, no c/o pain) Rt shoulder abduction: 4/5* (pain)  Right elbow flexion 5/5 (denies pain)   03/02/23:  Rt shoulder IR 5/5 no significant pain  Rt shoulder ER 5/5 no significant pain Rt shoulder flexion: 5-/5 (no c/o pain) Rt shoulder abduction: 4+/5* (pain)  Right elbow flexion 5/5 (denies pain)   Palpation: -Anterior shoulder tenderness with pain when Rt subscapularis tendon is exposed -lateral subscapularis palpation abnormal "feels like being poked with a needle"    Postural assessment:  -very mild anterior deviation of scapulae with mild tightness Rt calvicular pec  major, Pec minor also tight, but both WNL.    Passive Accessory Motion (01/19/23) Glenohumeral Joint: anterior glide: WNL, posterior glide: mild restriction, inferior glide: moderate restriction at 90 deg ABD       TREATMENT TODAY   Manual Therapy - for symptom modulation, soft tissue sensitivity and mobility, joint mobility, ROM   R shoulder PROM within pt tolerance x 5 minutes;   -Pt has full passive flexion with mild discomfort with end-range flexion; moderate shoulder complex abduction motion loss; minimal motion loss with ER and ABD  Glenohumeral mobilization, inferior; gr III; 2 x 30 sec at 90 deg abduction and 120 deg abduction   Scapular mobilization, in L sidelying; upward rotation and posterior tilt; x 3 minutes   *not today* STM R anterior and middle deltoid  x 3 minutes; GHJ A-P mobilization at gr II for pain control; 1 x 30 sec bout With R shoulder in 120 deg abduction, STM of R subscapularis; x 2 minutes STM R anterior and middle deltoid, pectoralis major x 3 minutes; Trigger Point Dry Needling (TDN), unbilled Education performed with patient regarding potential benefits and mild adverse effects of TDN at previous visit.  Pt provided verbal consent to treatment. TDN performed to R middle deltoid with 0.25 x 40 single needle placements with local twitch response (LTR). Pistoning technique utilized. Improved pain-free motion following intervention.     Therapeutic Exercise - for improved soft tissue flexibility and extensibility as needed for ROM, isometrics for analgesic effect and to promote soft tissue healing, periscapular re-training for scapular positioning and scapulothoracic mechanics  Upper body ergometer, 2 minutes forward, 2 minutes backward - for tissue warm-up to improve muscle performance, improved soft tissue mobility/extensibility - subjective information gathered during this time  Sidelying shoulder abduction, 2-lb Dbell; 2x10  Wall angel, attempted;  difficultly attaining ER for contact with wall  Nautilus lat pulldown; 50-lb; 2x10; for periscapular strengthening and AAROM for overhead mobility during eccentric phase   Standing scaption with 4-lb Dbells bilat UE; 2x10  -mirror feedback to attain scapular plane  and maintain proper posture  Shoulder ER and IR at 90/90, Red Tband; 1x10 ea dir   PATIENT EDUCATION: HEP review; reviewed single-arm pec stretching in doorway.    *not today* Wall ball stabilization; flexion and abduction; 4.4 lb ball; 2 x 30 reps CW/CCW per direction Shoulder abduction isometric, elbow at door frame; x10, 5 sec hold  Tband high row, Blue Tband; 2x10 Shoulder ADD with [place] Si Tband, ABD AAROM; x20 Wall Y with Green Tband; 2x10 Shoulder IR with Black Tband; 2x10 Serratus slide with foam roller at wall 2x8, with Red Tband at wrists Prone W; 2x10, 3 sec hold Horizontal abduction in doorway with elbow flexed 90 deg; 2x10 with scap retraction Wall angel; attempted, difficulty maintaining ER and discomfort in anterior shoulder Prone T; 2x10, 3 sec hold  -tactile and verbal cueing for scapular retraction/depression Pec stretch in doorway, shoulders at 90 deg abduction; 2 x 30 sec Shoulder IR isometric at door frame, x 10, 5 sec isometric hold     HOME EXERCISE PROGRAM Per Alvera Novel, DPT at initial eval -seated blueTB row with endrange scapular retraction 1x10x3secH   Access Code: [email or web address] URL: [email or web address] Date: 02/23/2023 Prepared by: Consuela Mimes  Exercises - Doorway Pec Stretch at 90 Degrees Abduction  - 2 x daily - 7 x weekly - 3 sets - 30sec hold - Shoulder Internal Rotation with Resistance  - 1 x daily - 7 x weekly - 2 sets - 10 reps - Prone W Scapular Retraction  - 1 x daily - 7 x weekly - 2 sets - 10 reps - Standing Low Trap Setting with Resistance at Wall  - 1 x daily - 7 x weekly - 2 sets - 10 reps - Standing Isometric Shoulder Abduction with Doorway - Arm Bent   - 2 x daily - 7 x weekly - 2 sets - 10 reps - 5sec hold       PT Short Term Goals -                PT SHORT TERM GOAL #1    Title Pt to reports tolerance and compliance to HEP assignment.     Baseline Eval: baseline HEP initiated.  02/23/23: Good compliance with HEP.     Time 2     Period Weeks     Status ACHIEVED    Target Date 01/31/23          PT SHORT TERM GOAL #2    Title Pt to report improved use of RUE in ADL and household activity, less pain, less perceived weakness.     Baseline Eval: compensating with LUE, arm feels weak when lifting heavy kitchen items. 02/23/23: Remaining difficulty with lifting heavy pot, overhead reaching 03/02/23: Difficulty with iron skillet, other items under 5 lbs or not an issue    Time 3     Period Weeks     Status MOSTLY MET     Target Date 02/07/23                       PT Long Term Goals                PT LONG TERM GOAL #1    Title Pt to report less functional disability of RUE AEB score on FOTO survey score improvement >12.     Baseline eval: 50 02/23/23: 56/66 03/02/23: 57/66     Time 8     Period Weeks     Status  IN PROGRESS    Target Date 03/14/23          PT LONG TERM GOAL #2    Title Pt to demonstrate 5/5 and painfree MMT RUE shoulder flexion, ABD, IR, ER to indicated improved funcitonal load tolerance.     Baseline Pain with most of these at eval, most pain with resisted IR and deep palpation of subscapularis tendon.  02/23/23: Pain with resisted R shoulder abduction only, shoulder flexion strength 4/5 03/02/23: Pain and relative weakness with resisted abduction only    Time 8     Period Weeks     Status IN PROGRESS    Target Date 03/14/23          PT LONG TERM GOAL #3    Title Pt to report beginning to integrate overhead lifts as desired to personal training session without exacerbation of pain.     Baseline eval: avoiding overhead lifts with trainer  02/23/23: foam roller serratus slide overhead only, resistance  bands overhead pulldown. No recent overhead pressing/pushing. 03/02/23: Pt initiating overhead pressing with low weight without reproduction of symptoms or exacerbation    Time 8     Period Weeks     Status ACHIEVED    Target Date 03/14/23                       Plan -       Clinical Impression Statement Patient has remaining deficits with shoulder complex abduction and comfort with end-range shoulder elevation. She has notably improved tolerance of loading R deltoid and RTC. Pt does still have difficulty with tolerating reaching behind/up her back. We discussed stretching for pec/anterior shoulder capsule that she can continue with her other mobility exercises at home.  Patient will benefit from continued skilled therapeutic intervention to address the above deficits as needed for improved function and QoL.      Personal Factors and Comorbidities Fitness;Behavior Pattern;Time since onset of injury/illness/exacerbation     Examination-Activity Limitations Reach Overhead;Bathing;Carry;Dressing;Hygiene/Grooming     Examination-Participation Restrictions Meal Prep;Cleaning;Community Activity;Laundry;Yard Work     Stability/Clinical Decision Making Stable/Uncomplicated     Clinical Decision Making Moderate     Rehab Potential Excellent     PT Frequency 2x / week     PT Duration 2-3 weeks     PT Treatment/Interventions Electrical Stimulation;Moist Heat;Functional mobility training;Therapeutic exercise;Therapeutic activities;Patient/family education;Manual techniques;Dry needling;Passive range of motion     PT Next Visit Plan Continue with specific stretching and periscapular re-training to reduce anterior scapular tilt/protraction, continue with progressive resistance exercise for prime shoulder flexors/abductors and internal rotators. Graded introduction of overhead compound exercises e.g. pulldowns, overhead pressing; progress PRE as tolerated.    PT Home Exercise Plan See above    Consulted  and Agree with Plan of Care Patient                   Consuela Mimes, PT, DPT #[id number]  Gertie Exon, PT 03/09/2023, 8:19 AM

## 2023-03-11 ENCOUNTER — Encounter: Payer: Self-pay | Admitting: Physical Therapy

## 2023-03-14 ENCOUNTER — Ambulatory Visit: Payer: Medicare Other | Admitting: Physical Therapy

## 2023-03-14 DIAGNOSIS — G8929 Other chronic pain: Secondary | ICD-10-CM

## 2023-03-14 DIAGNOSIS — M6281 Muscle weakness (generalized): Secondary | ICD-10-CM | POA: Diagnosis not present

## 2023-03-14 NOTE — Therapy (Unsigned)
OUTPATIENT PHYSICAL THERAPY TREATMENT    Patient Name: Marissa George MRN: [id number] DOB:[birth year], 72 y.o., female Today's Date: 03/14/2023  END OF SESSION:   PT End of Session - 03/14/23 1334     Visit Number 13    Number of Visits 16    Date for PT Re-Evaluation 03/25/23    Authorization Type Medicare A & B    Authorization Time Period 01/17/23-03/14/23    Progress Note Due on Visit 10    PT Start Time 1330    PT Stop Time 1412    PT Time Calculation (min) 42 min    Activity Tolerance Patient tolerated treatment well;No increased pain    Behavior During Therapy [facility] for tasks assessed/performed              No past medical history on file. Past Surgical History:  Procedure Laterality Date   BREAST BIOPSY Left 2001   neg   BREAST BIOPSY Right    neg   There are no problems to display for this patient.   PCP: Antonieta Loveless, MD REFERRING PROVIDER: Katherina Right, MD  REFERRING DIAG: [phone] (ICD-10-CM) - Pain in right shoulder   THERAPY DIAG:  Muscle weakness (generalized)  Chronic right shoulder pain  Rationale for Evaluation and Treatment Rehabilitation  PERTINENT HISTORY: Pt reports inisious injury to Rt shoulder, initially unable to lift arm into ABD or flexion, but better able to move things, it just feels weak and retricted, unable to tuck in shirts behind back on Rt or reach back of neck on Right   Pt is referred to OPPT for acute ongoing Rt shoulder pain that started ~6 months prior to evaluation (early Jan 2024). She was referred to orthopedics but her appointment canceled 3x. 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 She is working on training with Systems analyst and working on [employer] stretching as discussed last visit.    PAIN:  Are you having pain? Pt denies pain at rest, pain with resisted shoulder elevation and overhead activity/reaching behind back     OBJECTIVE: (  objective measures completed at initial evaluation unless otherwise dated)   A/ROM:  Rt shoulder flexion: 128 degrees,  Rt shoulder ABDCT: 78 degrees  Right shoulder ER: T1  Right shoulder IR: T6   P/ROM:  Rt shoulder flexion 138 degrees (end range pain) Rt shoulder ABDCT: 114 degrees (end range pain)  Rt shoulder ER 70 degrees (guarded but able to get to end-range)  Rt shoulder IR: 65   MMT:  Rt shoulder IR 5/5 painful  Rt shoulder ER 5/5 (just a little sore) Rt shoulder flexion (supine 0-90), ok Right shoulder flexion (standing) 5/5 bilat  Right infraspinatus MMT: 5/5 pain free Right elbow flexion 4+/5 (r/o biceps tendon) ok Right extension 5/5 ok  02/23/23:  Rt shoulder IR 5/5 no significant pain  Rt shoulder ER 5/5 no significant pain Rt shoulder flexion: 4/5 (difficult, no c/o pain) Rt shoulder abduction: 4/5* (pain)  Right elbow flexion 5/5 (denies pain)   03/02/23:  Rt shoulder IR 5/5 no significant pain  Rt shoulder ER 5/5 no significant pain Rt shoulder flexion: 5-/5 (no c/o pain) Rt shoulder abduction: 4+/5* (pain)  Right elbow flexion 5/5 (denies pain)   Palpation: -Anterior shoulder tenderness with pain when Rt subscapularis tendon is exposed -lateral subscapularis palpation abnormal "feels like being poked with a needle"    Postural assessment:  -very mild anterior deviation of scapulae with mild tightness Rt calvicular pec major, Pec minor also tight, but both WNL.    Passive Accessory Motion  (01/19/23) Glenohumeral Joint: anterior glide: WNL, posterior glide: mild restriction, inferior glide: moderate restriction at 90 deg ABD       TREATMENT TODAY   Manual Therapy - for symptom modulation, soft tissue sensitivity and mobility, joint mobility, ROM   R shoulder PROM within pt tolerance x 5 minutes;   -Pt has full passive flexion with mild discomfort with end-range flexion; moderate shoulder complex abduction motion loss; minimal motion loss with ER and ABD  Glenohumeral mobilization, inferior; gr III; 2 x 30 sec at 90 deg abduction and 120 deg abduction   Scapular mobilization, in L sidelying; upward rotation and posterior tilt; x 3 minutes   *not today* STM R anterior and middle deltoid  x 3 minutes; GHJ A-P mobilization at gr II for pain control; 1 x 30 sec bout With R shoulder in 120 deg abduction, STM of R subscapularis; x 2 minutes STM R anterior and middle deltoid, pectoralis major x 3 minutes; Trigger Point Dry Needling (TDN), unbilled Education performed with patient regarding potential benefits and mild adverse effects of TDN at previous visit.  Pt provided verbal consent to treatment. TDN performed to R middle deltoid with 0.25 x 40 single needle placements with local twitch response (LTR). Pistoning technique utilized. Improved pain-free motion following intervention.     Therapeutic Exercise - for improved soft tissue flexibility and extensibility as needed for ROM, isometrics for analgesic effect and to promote soft tissue healing, periscapular re-training for scapular positioning and scapulothoracic mechanics  Upper body ergometer, 2 minutes forward, 2 minutes backward - for tissue warm-up to improve muscle performance, improved soft tissue mobility/extensibility - subjective information gathered during this time  Sidelying shoulder abduction, 3-lb Dbell; 2x8  Column L at doorway; 2x10  Shoulder ER and IR at 90/90, Red Tband; 2x10 ea dir   Standing  scaption with 4-lb Dbells bilat UE; 2x10  -mirror feedback to attain scapular plane and maintain proper posture  Standing overhead press; 3-lb Dbell; 2x8  PATIENT EDUCATION: HEP review; reviewed  single-arm pec stretching in doorway.    *not today* Nautilus lat pulldown; 50-lb; 2x10; for periscapular strengthening and AAROM for overhead mobility during eccentric phase  Wall angel, attempted; difficultly attaining ER for contact with wall Wall ball stabilization; flexion and abduction; 4.4 lb ball; 2 x 30 reps CW/CCW per direction Shoulder abduction isometric, elbow at door frame; x10, 5 sec hold  Tband high row, Blue Tband; 2x10 Shoulder ADD with [place] Si Tband, ABD AAROM; x20 Wall Y with Green Tband; 2x10 Shoulder IR with Black Tband; 2x10 Serratus slide with foam roller at wall 2x8, with Red Tband at wrists Prone W; 2x10, 3 sec hold Horizontal abduction in doorway with elbow flexed 90 deg; 2x10 with scap retraction Wall angel; attempted, difficulty maintaining ER and discomfort in anterior shoulder Prone T; 2x10, 3 sec hold  -tactile and verbal cueing for scapular retraction/depression Pec stretch in doorway, shoulders at 90 deg abduction; 2 x 30 sec Shoulder IR isometric at door frame, x 10, 5 sec isometric hold     HOME EXERCISE PROGRAM Per Alvera Novel, DPT at initial eval -seated blueTB row with endrange scapular retraction 1x10x3secH   Access Code: [email or web address] URL: [email or web address] Date: 02/23/2023 Prepared by: Consuela Mimes  Exercises - Doorway Pec Stretch at 90 Degrees Abduction  - 2 x daily - 7 x weekly - 3 sets - 30sec hold - Shoulder Internal Rotation with Resistance  - 1 x daily - 7 x weekly - 2 sets - 10 reps - Prone W Scapular Retraction  - 1 x daily - 7 x weekly - 2 sets - 10 reps - Standing Low Trap Setting with Resistance at Wall  - 1 x daily - 7 x weekly - 2 sets - 10 reps - Standing Isometric Shoulder Abduction with Doorway - Arm Bent  -  2 x daily - 7 x weekly - 2 sets - 10 reps - 5sec hold       PT Short Term Goals -                PT SHORT TERM GOAL #1    Title Pt to reports tolerance and compliance to HEP assignment.     Baseline Eval: baseline HEP initiated.  02/23/23: Good compliance with HEP.     Time 2     Period Weeks     Status ACHIEVED    Target Date 01/31/23          PT SHORT TERM GOAL #2    Title Pt to report improved use of RUE in ADL and household activity, less pain, less perceived weakness.     Baseline Eval: compensating with LUE, arm feels weak when lifting heavy kitchen items. 02/23/23: Remaining difficulty with lifting heavy pot, overhead reaching 03/02/23: Difficulty with iron skillet, other items under 5 lbs or not an issue    Time 3     Period Weeks     Status MOSTLY MET     Target Date 02/07/23                       PT Long Term Goals                PT LONG TERM GOAL #1    Title Pt to report less functional disability of RUE AEB score on FOTO survey score improvement >12.     Baseline eval: 50 02/23/23: 56/66 03/02/23: 57/66     Time 8     Period Tania Ade  Status IN PROGRESS    Target Date 03/14/23          PT LONG TERM GOAL #2    Title Pt to demonstrate 5/5 and painfree MMT RUE shoulder flexion, ABD, IR, ER to indicated improved funcitonal load tolerance.     Baseline Pain with most of these at eval, most pain with resisted IR and deep palpation of subscapularis tendon.  02/23/23: Pain with resisted R shoulder abduction only, shoulder flexion strength 4/5 03/02/23: Pain and relative weakness with resisted abduction only    Time 8     Period Weeks     Status IN PROGRESS    Target Date 03/14/23          PT LONG TERM GOAL #3    Title Pt to report beginning to integrate overhead lifts as desired to personal training session without exacerbation of pain.     Baseline eval: avoiding overhead lifts with trainer  02/23/23: foam roller serratus slide overhead only, resistance bands  overhead pulldown. No recent overhead pressing/pushing. 03/02/23: Pt initiating overhead pressing with low weight without reproduction of symptoms or exacerbation    Time 8     Period Weeks     Status ACHIEVED    Target Date 03/14/23                       Plan -       Clinical Impression Statement Patient has remaining deficits with shoulder complex abduction and comfort with end-range shoulder elevation. She has notably improved tolerance of loading R deltoid and RTC. Pt does still have difficulty with tolerating reaching behind/up her back. We discussed stretching for pec/anterior shoulder capsule that she can continue with her other mobility exercises at home.  Patient will benefit from continued skilled therapeutic intervention to address the above deficits as needed for improved function and QoL.      Personal Factors and Comorbidities Fitness;Behavior Pattern;Time since onset of injury/illness/exacerbation     Examination-Activity Limitations Reach Overhead;Bathing;Carry;Dressing;Hygiene/Grooming     Examination-Participation Restrictions Meal Prep;Cleaning;Community Activity;Laundry;Yard Work     Stability/Clinical Decision Making Stable/Uncomplicated     Clinical Decision Making Moderate     Rehab Potential Excellent     PT Frequency 2x / week     PT Duration 2-3 weeks     PT Treatment/Interventions Electrical Stimulation;Moist Heat;Functional mobility training;Therapeutic exercise;Therapeutic activities;Patient/family education;Manual techniques;Dry needling;Passive range of motion     PT Next Visit Plan Continue with specific stretching and periscapular re-training to reduce anterior scapular tilt/protraction, continue with progressive resistance exercise for prime shoulder flexors/abductors and internal rotators. Graded introduction of overhead compound exercises e.g. pulldowns, overhead pressing; progress PRE as tolerated.    PT Home Exercise Plan See above    Consulted and  Agree with Plan of Care Patient                   Consuela Mimes, PT, DPT #[id number]  Gertie Exon, PT 03/14/2023, 1:34 PM

## 2023-03-15 ENCOUNTER — Encounter: Payer: Self-pay | Admitting: Physical Therapy

## 2023-03-16 ENCOUNTER — Ambulatory Visit: Payer: Medicare Other | Admitting: Physical Therapy

## 2023-03-16 ENCOUNTER — Encounter: Payer: Self-pay | Admitting: Physical Therapy

## 2023-03-16 DIAGNOSIS — M6281 Muscle weakness (generalized): Secondary | ICD-10-CM | POA: Diagnosis not present

## 2023-03-16 DIAGNOSIS — G8929 Other chronic pain: Secondary | ICD-10-CM

## 2023-03-16 NOTE — Therapy (Signed)
OUTPATIENT PHYSICAL THERAPY TREATMENT    Patient Name: Marissa George MRN: [id number] DOB:Feb 20, 1951, 72 y.o., female Today's Date: 03/16/2023  END OF SESSION:   PT End of Session - 03/16/23 0722     Visit Number 14    Number of Visits 16    Date for PT Re-Evaluation 03/25/23    Authorization Type Medicare A & B    Authorization Time Period 01/17/23-03/14/23    Progress Note Due on Visit 10    PT Start Time 0728    PT Stop Time 0815    PT Time Calculation (min) 47 min    Activity Tolerance Patient tolerated treatment well;No increased pain    Behavior During Therapy [facility] for tasks assessed/performed              History reviewed. No pertinent past medical history. Past Surgical History:  Procedure Laterality Date   BREAST BIOPSY Left 2001   neg   BREAST BIOPSY Right    neg   There are no problems to display for this patient.   PCP: Antonieta Loveless, MD REFERRING PROVIDER: Katherina Right, MD  REFERRING DIAG: [phone] (ICD-10-CM) - Pain in right shoulder   THERAPY DIAG:  Muscle weakness (generalized)  Chronic right shoulder pain  Rationale for Evaluation and Treatment Rehabilitation  PERTINENT HISTORY: Pt reports inisious injury to Rt shoulder, initially unable to lift arm into ABD or flexion, but better able to move things, it just feels weak and retricted, unable to tuck in shirts behind back on Rt or reach back of neck on Right   Pt is referred to OPPT for acute ongoing Rt shoulder pain that started ~6 months prior to evaluation (early Jan 2024). She was referred to orthopedics but her appointment canceled 3x. 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]         OBJECTIVE: (objective measures completed at initial evaluation  unless otherwise dated)   A/ROM:  Rt shoulder flexion: 128 degrees,  Rt shoulder ABDCT: 78 degrees  Right shoulder ER: T1  Right shoulder IR: T6   P/ROM:  Rt shoulder flexion 138 degrees (end range pain) Rt shoulder ABDCT: 114 degrees (end range pain)  Rt shoulder ER 70 degrees (guarded but able to get to end-range)  Rt shoulder IR: 65   MMT:  Rt shoulder IR 5/5 painful  Rt shoulder ER 5/5 (just a little sore) Rt shoulder flexion (supine 0-90), ok Right shoulder flexion (standing) 5/5 bilat  Right infraspinatus MMT: 5/5 pain free Right elbow flexion 4+/5 (r/o biceps tendon) ok Right extension 5/5 ok  02/23/23:  Rt shoulder IR 5/5 no significant pain  Rt shoulder ER 5/5 no significant pain Rt shoulder flexion: 4/5 (difficult, no c/o pain) Rt shoulder abduction: 4/5* (pain)  Right elbow flexion 5/5 (denies pain)   03/02/23:  Rt shoulder IR 5/5 no significant pain  Rt shoulder ER 5/5 no significant pain Rt shoulder flexion: 5-/5 (no c/o pain) Rt shoulder abduction: 4+/5* (pain)  Right elbow flexion 5/5 (denies pain)   Palpation: -Anterior shoulder tenderness with pain when Rt subscapularis tendon is exposed -lateral subscapularis palpation abnormal "feels like being poked with a needle"    Postural assessment:  -very mild anterior deviation of scapulae with mild tightness Rt calvicular pec major, Pec  minor also tight, but both WNL.    Passive Accessory Motion (01/19/23) Glenohumeral Joint: anterior glide: WNL, posterior glide: mild restriction, inferior glide: moderate restriction at 90 deg ABD       TREATMENT TODAY   SUBJECTIVE STATEMENT:  Pt reports feeling like she needed ice after PT and training combined on Tuesday. Patient reports completing upper body workout including overhead movements on Monday. Patient reports no notable aching, but she states "I can tell I used it."    Manual Therapy - for symptom modulation, soft tissue sensitivity and mobility, joint  mobility, ROM   R shoulder PROM within pt tolerance x 5 minutes;   -Pt has full passive flexion with mild discomfort with end-range flexion; moderate shoulder complex abduction motion loss; minimal motion loss with ER and ABD  Glenohumeral mobilization, inferior; gr III; 2 x 30 sec at 90 deg abduction and 120 deg abduction   Scapular mobilization, in L sidelying; upward rotation and posterior tilt; x 3 minutes   *not today* STM R anterior and middle deltoid  x 3 minutes; GHJ A-P mobilization at gr II for pain control; 1 x 30 sec bout With R shoulder in 120 deg abduction, STM of R subscapularis; x 2 minutes STM R anterior and middle deltoid, pectoralis major x 3 minutes; Trigger Point Dry Needling (TDN), unbilled Education performed with patient regarding potential benefits and mild adverse effects of TDN at previous visit.  Pt provided verbal consent to treatment. TDN performed to R middle deltoid with 0.25 x 40 single needle placements with local twitch response (LTR). Pistoning technique utilized. Improved pain-free motion following intervention.     Therapeutic Exercise - for improved soft tissue flexibility and extensibility as needed for ROM, isometrics for analgesic effect and to promote soft tissue healing, periscapular re-training for scapular positioning and scapulothoracic mechanics  Upper body ergometer, 2 minutes forward, 2 minutes backward - for tissue warm-up to improve muscle performance, improved soft tissue mobility/extensibility - subjective information gathered during this time incrementally, 2 min unbilled  Sidelying shoulder abduction, 3-lb Dbell; 2x8  Column L at doorway; 2x10  Shoulder ER and IR at 90/90, Red Tband; 2x10 ea dir   Single-arm pulldown, Black Tband anchored on top hook; 2x10, scap retraction/depression at bottom of range   Standing overhead press; 3-lb Dbell; 2x8   PATIENT EDUCATION: HEP review; discussed expected progression with continued plan  of care and encouraged continued work with Systems analyst with progression of upper body/overhead lifts with low to moderate intensity as tolerated.     *not today* Standing scaption with 4-lb Dbells bilat UE; 2x10  -mirror feedback to attain scapular plane and maintain proper posture Nautilus lat pulldown; 50-lb; 2x10; for periscapular strengthening and AAROM for overhead mobility during eccentric phase  Wall angel, attempted; difficultly attaining ER for contact with wall Wall ball stabilization; flexion and abduction; 4.4 lb ball; 2 x 30 reps CW/CCW per direction Shoulder abduction isometric, elbow at door frame; x10, 5 sec hold  Tband high row, Blue Tband; 2x10 Shoulder ADD with [place] Si Tband, ABD AAROM; x20 Wall Y with Green Tband; 2x10 Shoulder IR with Black Tband; 2x10 Serratus slide with foam roller at wall 2x8, with Red Tband at wrists Prone W; 2x10, 3 sec hold Horizontal abduction in doorway with elbow flexed 90 deg; 2x10 with scap retraction Wall angel; attempted, difficulty maintaining ER and discomfort in anterior shoulder Prone T; 2x10, 3 sec hold  -tactile and verbal cueing for scapular retraction/depression Pec stretch in doorway, shoulders  at 90 deg abduction; 2 x 30 sec Shoulder IR isometric at door frame, x 10, 5 sec isometric hold     HOME EXERCISE PROGRAM Per Alvera Novel, DPT at initial eval -seated blueTB row with endrange scapular retraction 1x10x3secH   Access Code: [email or web address] URL: [email or web address] Date: 02/23/2023 Prepared by: Consuela Mimes  Exercises - Doorway Pec Stretch at 90 Degrees Abduction  - 2 x daily - 7 x weekly - 3 sets - 30sec hold - Shoulder Internal Rotation with Resistance  - 1 x daily - 7 x weekly - 2 sets - 10 reps - Prone W Scapular Retraction  - 1 x daily - 7 x weekly - 2 sets - 10 reps - Standing Low Trap Setting with Resistance at Wall  - 1 x daily - 7 x weekly - 2 sets - 10 reps - Standing Isometric Shoulder  Abduction with Doorway - Arm Bent  - 2 x daily - 7 x weekly - 2 sets - 10 reps - 5sec hold       PT Short Term Goals -                PT SHORT TERM GOAL #1    Title Pt to reports tolerance and compliance to HEP assignment.     Baseline Eval: baseline HEP initiated.  02/23/23: Good compliance with HEP.     Time 2     Period Weeks     Status ACHIEVED    Target Date 01/31/23          PT SHORT TERM GOAL #2    Title Pt to report improved use of RUE in ADL and household activity, less pain, less perceived weakness.     Baseline Eval: compensating with LUE, arm feels weak when lifting heavy kitchen items. 02/23/23: Remaining difficulty with lifting heavy pot, overhead reaching 03/02/23: Difficulty with iron skillet, other items under 5 lbs or not an issue    Time 3     Period Weeks     Status MOSTLY MET     Target Date 02/07/23                       PT Long Term Goals                PT LONG TERM GOAL #1    Title Pt to report less functional disability of RUE AEB score on FOTO survey score improvement >12.     Baseline eval: 50 02/23/23: 56/66 03/02/23: 57/66     Time 8     Period Weeks     Status IN PROGRESS    Target Date 03/14/23          PT LONG TERM GOAL #2    Title Pt to demonstrate 5/5 and painfree MMT RUE shoulder flexion, ABD, IR, ER to indicated improved funcitonal load tolerance.     Baseline Pain with most of these at eval, most pain with resisted IR and deep palpation of subscapularis tendon.  02/23/23: Pain with resisted R shoulder abduction only, shoulder flexion strength 4/5 03/02/23: Pain and relative weakness with resisted abduction only    Time 8     Period Weeks     Status IN PROGRESS    Target Date 03/14/23          PT LONG TERM GOAL #3    Title Pt to report beginning to integrate overhead lifts as desired to personal training session without exacerbation of  pain.     Baseline eval: avoiding overhead lifts with trainer  02/23/23: foam roller serratus  slide overhead only, resistance bands overhead pulldown. No recent overhead pressing/pushing. 03/02/23: Pt initiating overhead pressing with low weight without reproduction of symptoms or exacerbation    Time 8     Period Weeks     Status ACHIEVED    Target Date 03/14/23                       Plan -       Clinical Impression Statement Patient has been able to resume overhead upper body workouts with her personal training regimen. She did report excessive soreness following PT and personal training this past Monday which both included overhead work and lifting. Patient demonstrates minimal deficit remaining with forward elevation and near-full abduction passively following manual therapy. We continued with GHJ and scapular mobility work to improve tolerance of shoulder complex ROM. Pt tolerates continued work on overhead pressing well. Patient will benefit from continued skilled therapeutic intervention to address the above deficits as needed for improved function and QoL.      Personal Factors and Comorbidities Fitness;Behavior Pattern;Time since onset of injury/illness/exacerbation     Examination-Activity Limitations Reach Overhead;Bathing;Carry;Dressing;Hygiene/Grooming     Examination-Participation Restrictions Meal Prep;Cleaning;Community Activity;Laundry;Yard Work     Stability/Clinical Decision Making Stable/Uncomplicated     Clinical Decision Making Moderate     Rehab Potential Excellent     PT Frequency 2x / week     PT Duration 2-3 weeks     PT Treatment/Interventions Electrical Stimulation;Moist Heat;Functional mobility training;Therapeutic exercise;Therapeutic activities;Patient/family education;Manual techniques;Dry needling;Passive range of motion     PT Next Visit Plan Continue with specific stretching and periscapular re-training to reduce anterior scapular tilt/protraction, continue with progressive resistance exercise for prime shoulder flexors/abductors and internal  rotators. Graded introduction of overhead compound exercises e.g. pulldowns, overhead pressing; progress PRE as tolerated.    PT Home Exercise Plan See above    Consulted and Agree with Plan of Care Patient                   Consuela Mimes, PT, DPT #[id number]  Gertie Exon, PT 03/16/2023, 8:12 AM

## 2023-03-21 ENCOUNTER — Ambulatory Visit: Payer: Medicare Other | Attending: Family Medicine | Admitting: Physical Therapy

## 2023-03-21 ENCOUNTER — Encounter: Payer: Self-pay | Admitting: Physical Therapy

## 2023-03-21 DIAGNOSIS — G8929 Other chronic pain: Secondary | ICD-10-CM | POA: Diagnosis present

## 2023-03-21 DIAGNOSIS — M25511 Pain in right shoulder: Secondary | ICD-10-CM | POA: Diagnosis present

## 2023-03-21 DIAGNOSIS — M6281 Muscle weakness (generalized): Secondary | ICD-10-CM | POA: Insufficient documentation

## 2023-03-21 NOTE — Therapy (Signed)
OUTPATIENT PHYSICAL THERAPY TREATMENT    Patient Name: Marissa George MRN: [id number] DOB:30-Apr-1951, 72 y.o., female Today's Date: 03/21/2023  END OF SESSION:   PT End of Session - 03/21/23 0729     Visit Number 15    Number of Visits 16    Date for PT Re-Evaluation 03/25/23    Authorization Type Medicare A & B    Authorization Time Period 01/17/23-03/14/23    Progress Note Due on Visit 10    PT Start Time 0729    PT Stop Time 0813    PT Time Calculation (min) 44 min    Activity Tolerance Patient tolerated treatment well;No increased pain    Behavior During Therapy [facility] for tasks assessed/performed              History reviewed. No pertinent past medical history. Past Surgical History:  Procedure Laterality Date   BREAST BIOPSY Left 2001   neg   BREAST BIOPSY Right    neg   There are no problems to display for this patient.   PCP: Antonieta Loveless, MD REFERRING PROVIDER: Katherina Right, MD  REFERRING DIAG: [phone] (ICD-10-CM) - Pain in right shoulder   THERAPY DIAG:  Muscle weakness (generalized)  Chronic right shoulder pain  Rationale for Evaluation and Treatment Rehabilitation  PERTINENT HISTORY: Pt reports inisious injury to Rt shoulder, initially unable to lift arm into ABD or flexion, but better able to move things, it just feels weak and retricted, unable to tuck in shirts behind back on Rt or reach back of neck on Right   Pt is referred to OPPT for acute ongoing Rt shoulder pain that started ~6 months prior to evaluation (early Jan 2024). She was referred to orthopedics but her appointment canceled 3x. 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]    OBJECTIVE: (objective measures completed at initial evaluation unless  otherwise dated)   A/ROM:  Rt shoulder flexion: 128 degrees,  Rt shoulder ABDCT: 78 degrees  Right shoulder ER: T1  Right shoulder IR: T6   P/ROM:  Rt shoulder flexion 138 degrees (end range pain) Rt shoulder ABDCT: 114 degrees (end range pain)  Rt shoulder ER 70 degrees (guarded but able to get to end-range)  Rt shoulder IR: 65   MMT:  Rt shoulder IR 5/5 painful  Rt shoulder ER 5/5 (just a little sore) Rt shoulder flexion (supine 0-90), ok Right shoulder flexion (standing) 5/5 bilat  Right infraspinatus MMT: 5/5 pain free Right elbow flexion 4+/5 (r/o biceps tendon) ok Right extension 5/5 ok  02/23/23:  Rt shoulder IR 5/5 no significant pain  Rt shoulder ER 5/5 no significant pain Rt shoulder flexion: 4/5 (difficult, no c/o pain) Rt shoulder abduction: 4/5* (pain)  Right elbow flexion 5/5 (denies pain)   03/02/23:  Rt shoulder IR 5/5 no significant pain  Rt shoulder ER 5/5 no significant pain Rt shoulder flexion: 5-/5 (no c/o pain) Rt shoulder abduction: 4+/5* (pain)  Right elbow flexion 5/5 (denies pain)   Palpation: -Anterior shoulder tenderness with pain when Rt subscapularis tendon is exposed -lateral subscapularis palpation abnormal "feels like being poked with a needle"    Postural assessment:  -very mild anterior deviation of scapulae with mild tightness Rt calvicular pec major, Pec minor also tight, but both  WNL.    Passive Accessory Motion (01/19/23) Glenohumeral Joint: anterior glide: WNL, posterior glide: mild restriction, inferior glide: moderate restriction at 90 deg ABD       TREATMENT TODAY   SUBJECTIVE STATEMENT:  Pt reports no significant pain this AM. She reports tolerating PT and personal training well last visit. She reports completing resisted abduction and wall slides as well as pectoral stretching at the gym and doing well with these. Patient reports no major new incidents since the weekend.     Manual Therapy - for symptom modulation,  soft tissue sensitivity and mobility, joint mobility, ROM   R shoulder PROM within pt tolerance x 3 minutes;   -Pt has full passive flexion with mild discomfort with end-range flexion; minimal shoulder complex abduction motion loss  Glenohumeral mobilization, inferior; gr III; 2 x 30 sec at 90 deg abduction and 120 deg abduction  Glenohumeral mobilization, A-P; gr III; x 30 sec bout  STM R anterior and middle deltoid  x 2 minutes;   *not today* Scapular mobilization, in L sidelying; upward rotation and posterior tilt; x 3 minutes GHJ A-P mobilization at gr II for pain control; 1 x 30 sec bout With R shoulder in 120 deg abduction, STM of R subscapularis; x 2 minutes STM R anterior and middle deltoid, pectoralis major x 3 minutes; Trigger Point Dry Needling (TDN), unbilled Education performed with patient regarding potential benefits and mild adverse effects of TDN at previous visit.  Pt provided verbal consent to treatment. TDN performed to R middle deltoid with 0.25 x 40 single needle placements with local twitch response (LTR). Pistoning technique utilized. Improved pain-free motion following intervention.     Therapeutic Exercise - for improved soft tissue flexibility and extensibility as needed for ROM, isometrics for analgesic effect and to promote soft tissue healing, periscapular re-training for scapular positioning and scapulothoracic mechanics  Upper body ergometer, 2 minutes forward, 2 minutes backward - for tissue warm-up to improve muscle performance, improved soft tissue mobility/extensibility - subjective information gathered during this time incrementally, 1 min unbilled  Wall Y with Blue Tband; 2x10  Shoulder abduction wall slide; x10, 3 sec at top of range  Shoulder ER and IR at 90/90, Red Tband; 2x10 ea dir   Single-arm pulldown, Black Tband anchored on top hook; 2x8, scap retraction/depression at bottom of range   Standing overhead press; 4-lb Dbell;  2x8   PATIENT EDUCATION: We discussed anticipated discharge this week and pt continuing personal training and advanced HEP at that time.     *not today* Column L at doorway; 2x10 Sidelying shoulder abduction, 3-lb Dbell; 2x8 Standing scaption with 4-lb Dbells bilat UE; 2x10  -mirror feedback to attain scapular plane and maintain proper posture Nautilus lat pulldown; 50-lb; 2x10; for periscapular strengthening and AAROM for overhead mobility during eccentric phase  Wall angel, attempted; difficultly attaining ER for contact with wall Wall ball stabilization; flexion and abduction; 4.4 lb ball; 2 x 30 reps CW/CCW per direction Shoulder abduction isometric, elbow at door frame; x10, 5 sec hold  Tband high row, Blue Tband; 2x10 Shoulder ADD with [place] Si Tband, ABD AAROM; x20 Shoulder IR with Black Tband; 2x10 Serratus slide with foam roller at wall 2x8, with Red Tband at wrists Prone W; 2x10, 3 sec hold Horizontal abduction in doorway with elbow flexed 90 deg; 2x10 with scap retraction Wall angel; attempted, difficulty maintaining ER and discomfort in anterior shoulder Prone T; 2x10, 3 sec hold  -tactile and verbal cueing for scapular retraction/depression Pec  stretch in doorway, shoulders at 90 deg abduction; 2 x 30 sec Shoulder IR isometric at door frame, x 10, 5 sec isometric hold     HOME EXERCISE PROGRAM Per Alvera Novel, DPT at initial eval -seated blueTB row with endrange scapular retraction 1x10x3secH   Access Code: [email or web address] URL: [email or web address] Date: 02/23/2023 Prepared by: Consuela Mimes  Exercises - Doorway Pec Stretch at 90 Degrees Abduction  - 2 x daily - 7 x weekly - 3 sets - 30sec hold - Shoulder Internal Rotation with Resistance  - 1 x daily - 7 x weekly - 2 sets - 10 reps - Prone W Scapular Retraction  - 1 x daily - 7 x weekly - 2 sets - 10 reps - Standing Low Trap Setting with Resistance at Wall  - 1 x daily - 7 x weekly - 2 sets - 10  reps - Standing Isometric Shoulder Abduction with Doorway - Arm Bent  - 2 x daily - 7 x weekly - 2 sets - 10 reps - 5sec hold       PT Short Term Goals -                PT SHORT TERM GOAL #1    Title Pt to reports tolerance and compliance to HEP assignment.     Baseline Eval: baseline HEP initiated.  02/23/23: Good compliance with HEP.     Time 2     Period Weeks     Status ACHIEVED    Target Date 01/31/23          PT SHORT TERM GOAL #2    Title Pt to report improved use of RUE in ADL and household activity, less pain, less perceived weakness.     Baseline Eval: compensating with LUE, arm feels weak when lifting heavy kitchen items. 02/23/23: Remaining difficulty with lifting heavy pot, overhead reaching 03/02/23: Difficulty with iron skillet, other items under 5 lbs or not an issue    Time 3     Period Weeks     Status MOSTLY MET     Target Date 02/07/23                       PT Long Term Goals                PT LONG TERM GOAL #1    Title Pt to report less functional disability of RUE AEB score on FOTO survey score improvement >12.     Baseline eval: 50 02/23/23: 56/66 03/02/23: 57/66     Time 8     Period Weeks     Status IN PROGRESS    Target Date 03/14/23          PT LONG TERM GOAL #2    Title Pt to demonstrate 5/5 and painfree MMT RUE shoulder flexion, ABD, IR, ER to indicated improved funcitonal load tolerance.     Baseline Pain with most of these at eval, most pain with resisted IR and deep palpation of subscapularis tendon.  02/23/23: Pain with resisted R shoulder abduction only, shoulder flexion strength 4/5 03/02/23: Pain and relative weakness with resisted abduction only    Time 8     Period Weeks     Status IN PROGRESS    Target Date 03/14/23          PT LONG TERM GOAL #3    Title Pt to report beginning to integrate overhead lifts as desired to personal training  session without exacerbation of pain.     Baseline eval: avoiding overhead lifts with  trainer  02/23/23: foam roller serratus slide overhead only, resistance bands overhead pulldown. No recent overhead pressing/pushing. 03/02/23: Pt initiating overhead pressing with low weight without reproduction of symptoms or exacerbation    Time 8     Period Weeks     Status ACHIEVED    Target Date 03/14/23                       Plan -       Clinical Impression Statement Patient tolerates compound pulling and pushing movements into overhead ROM well without significant reproduction of pain. She reports being able to work with heavier household items better (e.g cast iron skillet) without as much pain/discomfort. ROM is normalizing in all planes, though end-range flexion/abduction is still moderately painful. Patient will benefit from continued skilled therapeutic intervention to address the above deficits as needed for improved function and QoL. Transition to continued HEP and gradual progression of exercises with personal trainer is expected after next visit.      Personal Factors and Comorbidities Fitness;Behavior Pattern;Time since onset of injury/illness/exacerbation     Examination-Activity Limitations Reach Overhead;Bathing;Carry;Dressing;Hygiene/Grooming     Examination-Participation Restrictions Meal Prep;Cleaning;Community Activity;Laundry;Yard Work     Stability/Clinical Decision Making Stable/Uncomplicated     Clinical Decision Making Moderate     Rehab Potential Excellent     PT Frequency 2x / week     PT Duration 2-3 weeks     PT Treatment/Interventions Electrical Stimulation;Moist Heat;Functional mobility training;Therapeutic exercise;Therapeutic activities;Patient/family education;Manual techniques;Dry needling;Passive range of motion     PT Next Visit Plan Continue with specific stretching and periscapular re-training to reduce anterior scapular tilt/protraction, continue with progressive resistance exercise for prime shoulder flexors/abductors and internal rotators.  Graded introduction of overhead compound exercises e.g. pulldowns, overhead pressing; progress PRE as tolerated. Re-assess next visit.    PT Home Exercise Plan See above    Consulted and Agree with Plan of Care Patient                   Consuela Mimes, PT, DPT #[id number]  Gertie Exon, PT 03/21/2023, 10:04 AM

## 2023-03-23 ENCOUNTER — Ambulatory Visit: Payer: Medicare Other | Admitting: Physical Therapy

## 2023-03-23 ENCOUNTER — Encounter: Payer: Self-pay | Admitting: Physical Therapy

## 2023-03-23 DIAGNOSIS — M6281 Muscle weakness (generalized): Secondary | ICD-10-CM | POA: Diagnosis not present

## 2023-03-23 DIAGNOSIS — G8929 Other chronic pain: Secondary | ICD-10-CM

## 2023-03-23 NOTE — Therapy (Addendum)
OUTPATIENT PHYSICAL THERAPY TREATMENT/GOAL UPDATE AND DISCHARGE   Patient Name: Marissa George MRN: [id number] DOB:[birth year], 72 y.o., female Today's Date: 03/23/2023  END OF SESSION:   PT End of Session - 03/23/23 1300     Visit Number 16    Number of Visits 16    Date for PT Re-Evaluation 03/25/23    Authorization Type Medicare A & B    Authorization Time Period 01/17/23-03/14/23    Progress Note Due on Visit 10    PT Start Time 1250    PT Stop Time 1329    PT Time Calculation (min) 39 min    Activity Tolerance Patient tolerated treatment well;No increased pain    Behavior During Therapy [facility] for tasks assessed/performed               No past medical history on file. Past Surgical History:  Procedure Laterality Date   BREAST BIOPSY Left 2001   neg   BREAST BIOPSY Right    neg   There are no problems to display for this patient.   PCP: Antonieta Loveless, MD REFERRING PROVIDER: Katherina Right, MD  REFERRING DIAG: [phone] (ICD-10-CM) - Pain in right shoulder   THERAPY DIAG:  Muscle weakness (generalized)  Chronic right shoulder pain  Rationale for Evaluation and Treatment Rehabilitation  PERTINENT HISTORY: Pt reports inisious injury to Rt shoulder, initially unable to lift arm into ABD or flexion, but better able to move things, it just feels weak and retricted, unable to tuck in shirts behind back on Rt or reach back of neck on Right   Pt is referred to OPPT for acute ongoing Rt shoulder pain that started ~6 months prior to evaluation (early Jan 2024). She was referred to orthopedics but her appointment canceled 3x. 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]    OBJECTIVE: (objective measures completed at initial evaluation unless  otherwise dated)   A/ROM:  Rt shoulder flexion: 128 degrees,  Rt shoulder ABDCT: 78 degrees  Right shoulder ER: T1  Right shoulder IR: T6   P/ROM:  Rt shoulder flexion 138 degrees (end range pain) Rt shoulder ABDCT: 114 degrees (end range pain)  Rt shoulder ER 70 degrees (guarded but able to get to end-range)  Rt shoulder IR: 65   MMT:  Rt shoulder IR 5/5 painful  Rt shoulder ER 5/5 (just a little sore) Rt shoulder flexion (supine 0-90), ok Right shoulder flexion (standing) 5/5 bilat  Right infraspinatus MMT: 5/5 pain free Right elbow flexion 4+/5 (r/o biceps tendon) ok Right extension 5/5 ok  02/23/23:  Rt shoulder IR 5/5 no significant pain  Rt shoulder ER 5/5 no significant pain Rt shoulder flexion: 4/5 (difficult, no c/o pain) Rt shoulder abduction: 4/5* (pain)  Right elbow flexion 5/5 (denies pain)   03/02/23:  Rt shoulder IR 5/5 no significant pain  Rt shoulder ER 5/5 no significant pain Rt shoulder flexion: 5-/5 (no c/o pain) Rt shoulder abduction: 4+/5* (pain)  Right elbow flexion 5/5 (denies pain)    03/23/23:  Rt shoulder IR 5/5 no significant pain  Rt shoulder ER 5/5 no significant pain Rt shoulder flexion: 5-5 (no c/o pain) Rt shoulder abduction: 4+/5* (minor pain)  Right elbow flexion 5/5 (denies pain)    Palpation: -Anterior shoulder tenderness with  pain when Rt subscapularis tendon is exposed -lateral subscapularis palpation abnormal "feels like being poked with a needle"    Postural assessment:  -very mild anterior deviation of scapulae with mild tightness Rt calvicular pec major, Pec minor also tight, but both WNL.    Passive Accessory Motion (01/19/23) Glenohumeral Joint: anterior glide: WNL, posterior glide: mild restriction, inferior glide: moderate restriction at 90 deg ABD       TREATMENT TODAY   SUBJECTIVE STATEMENT:  Pt reports doing well with return to upper body workout routine including pulldowns, overhead press, serratus slide.  Patient reports doing these exercises without notable pain with 5-lb and 8-lb dumbbells. Patient reports 80% global rating of function at this time. Pt reports more independence with her exercise regimen. Pt reports compliance with HEP and integrating PT exercises into her workout routines with her trainer.    Manual Therapy - for symptom modulation, soft tissue sensitivity and mobility, joint mobility, ROM   R shoulder PROM within pt tolerance x 3 minutes;   -Pt has full passive flexion with mild discomfort with end-range flexion; minimal shoulder complex abduction motion loss  Glenohumeral mobilization, inferior; gr III; 2 x 30 sec at 90 deg abduction and 120 deg abduction   STM R anterior and middle deltoid  x 2 minutes;   *not today* Scapular mobilization, in L sidelying; upward rotation and posterior tilt; x 3 minutes GHJ A-P mobilization at gr II for pain control; 1 x 30 sec bout With R shoulder in 120 deg abduction, STM of R subscapularis; x 2 minutes STM R anterior and middle deltoid, pectoralis major x 3 minutes; Trigger Point Dry Needling (TDN), unbilled Education performed with patient regarding potential benefits and mild adverse effects of TDN at previous visit.  Pt provided verbal consent to treatment. TDN performed to R middle deltoid with 0.25 x 40 single needle placements with local twitch response (LTR). Pistoning technique utilized. Improved pain-free motion following intervention.     Therapeutic Exercise - for improved soft tissue flexibility and extensibility as needed for ROM, isometrics for analgesic effect and to promote soft tissue healing, periscapular re-training for scapular positioning and scapulothoracic mechanics   *GOAL UPDATE PERFORMED   Upper body ergometer, 2 minutes forward, 2 minutes backward - for tissue warm-up to improve muscle performance, improved soft tissue mobility/extensibility - subjective information gathered during this time  incrementally, 1 min unbilled  Wall Y with Blue Tband; 2x10  Shoulder abduction wall slide; x10, 3 sec at top of range  -discussed using this for movement prep for upper body resistance training  Shoulder ER and IR at 90/90, Red Tband; 2x10 ea dir   Standing overhead press; 4-lb Dbell; 2x8   PATIENT EDUCATION: We reviewed established HEP and discussed continued progression of intensity/volume per personal trainer moving forward with inclusion of upper body compound pushing/pulling movements in gym.     *not today* Single-arm pulldown, Black Tband anchored on top hook; 2x8, scap retraction/depression at bottom of range  Column L at doorway; 2x10 Sidelying shoulder abduction, 3-lb Dbell; 2x8 Standing scaption with 4-lb Dbells bilat UE; 2x10  -mirror feedback to attain scapular plane and maintain proper posture Nautilus lat pulldown; 50-lb; 2x10; for periscapular strengthening and AAROM for overhead mobility during eccentric phase  Wall angel, attempted; difficultly attaining ER for contact with wall Wall ball stabilization; flexion and abduction; 4.4 lb ball; 2 x 30 reps CW/CCW per direction Shoulder abduction isometric, elbow at door frame; x10, 5 sec hold  Tband high row,  Blue Tband; 2x10 Shoulder ADD with Green Tband, ABD AAROM; x20 Shoulder IR with Black Tband; 2x10 Serratus slide with foam roller at wall 2x8, with Red Tband at wrists Prone W; 2x10, 3 sec hold Horizontal abduction in doorway with elbow flexed 90 deg; 2x10 with scap retraction Wall angel; attempted, difficulty maintaining ER and discomfort in anterior shoulder Prone T; 2x10, 3 sec hold  -tactile and verbal cueing for scapular retraction/depression Pec stretch in doorway, shoulders at 90 deg abduction; 2 x 30 sec Shoulder IR isometric at door frame, x 10, 5 sec isometric hold     HOME EXERCISE PROGRAM Per Alvera Novel, DPT at initial eval -seated blueTB row with endrange scapular retraction 1x10x3secH    Access Code: [email or web address] URL: [email or web address] Date: 03/28/2023 Prepared by: Consuela Mimes  Exercises - Doorway Pec Stretch at 90 Degrees Abduction  - 2 x daily - 7 x weekly - 3 sets - 30sec hold - Prone W Scapular Retraction  - 1 x daily - 7 x weekly - 2 sets - 10 reps - Standing Low Trap Setting with Resistance at Wall  - 1 x daily - 7 x weekly - 2 sets - 10 reps - Standing Single Arm Shoulder External Rotation in Abduction with Anchored Resistance  - 1 x daily - 4 x weekly - 2 sets - 10 reps - Standing Single Arm Shoulder Internal Rotation in Abduction with Anchored Resistance  - 1 x daily - 4 x weekly - 2 sets - 10 reps       PT Short Term Goals -                PT SHORT TERM GOAL #1    Title Pt to reports tolerance and compliance to HEP assignment.     Baseline Eval: baseline HEP initiated.  02/23/23: Good compliance with HEP.     Time 2     Period Weeks     Status ACHIEVED    Target Date 01/31/23          PT SHORT TERM GOAL #2    Title Pt to report improved use of RUE in ADL and household activity, less pain, less perceived weakness.     Baseline Eval: compensating with LUE, arm feels weak when lifting heavy kitchen items. 02/23/23: Remaining difficulty with lifting heavy pot, overhead reaching 03/02/23: Difficulty with iron skillet, other items under 5 lbs or not an issue 03/23/23: Pt reports reaching up to cabinet and putting up plates well. She reports that lifting cast iron skillet is getting easier.     Time 3     Period Weeks     Status MOSTLY MET     Target Date 02/07/23                       PT Long Term Goals                PT LONG TERM GOAL #1    Title Pt to report less functional disability of RUE AEB score on FOTO survey score improvement >12.     Baseline eval: 50 02/23/23: 56/66 03/02/23: 57/66  03/23/23: 67/66    Time 8     Period Weeks     Status ACHIEVED    Target Date 03/14/23          PT LONG TERM GOAL #2    Title Pt  to demonstrate 5/5 and painfree MMT RUE shoulder flexion, ABD, IR, ER to  indicated improved funcitonal load tolerance.     Baseline Pain with most of these at eval, most pain with resisted IR and deep palpation of subscapularis tendon.  02/23/23: Pain with resisted R shoulder abduction only, shoulder flexion strength 4/5 03/02/23: Pain and relative weakness with resisted abduction only 03/23/23: Pain with resisted abduction only    Time 8     Period Weeks     Status PARTIALLY MET    Target Date 03/14/23          PT LONG TERM GOAL #3    Title Pt to report beginning to integrate overhead lifts as desired to personal training session without exacerbation of pain.     Baseline eval: avoiding overhead lifts with trainer  02/23/23: foam roller serratus slide overhead only, resistance bands overhead pulldown. No recent overhead pressing/pushing. 03/02/23: Pt initiating overhead pressing with low weight without reproduction of symptoms or exacerbation    Time 8     Period Weeks     Status ACHIEVED    Target Date 03/14/23                       Plan -       Clinical Impression Statement Patient has [facility] R shoulder active and passive ROM at this time and she has been able to resume her full workout regimen including upper body pulling/pushing movements including compound overhead lifts with light to moderate-weight dumbbells. She has met her FOTO goal and has substantially improved with ability to complete household ADLs/lifting. Pt does have some pain with resistance into abduction, but she exhibits good to excellent strength in all planes of motion and does not otherwise have notable pain with loading R shoulder. Pt has made excellent progress to date, and she is appropriate for continuing with personal training and gym/home-based exercise at this time. Pt is ready for discharge.      Personal Factors and Comorbidities Fitness;Behavior Pattern;Time since onset of injury/illness/exacerbation      Examination-Activity Limitations Reach Overhead;Bathing;Carry;Dressing;Hygiene/Grooming     Examination-Participation Restrictions Meal Prep;Cleaning;Community Activity;Laundry;Yard Work     Stability/Clinical Decision Making Stable/Uncomplicated     Clinical Decision Making Moderate     Rehab Potential Excellent     PT Frequency -    PT Duration -    PT Treatment/Interventions Electrical Stimulation;Moist Heat;Functional mobility training;Therapeutic exercise;Therapeutic activities;Patient/family education;Manual techniques;Dry needling;Passive range of motion     PT Next Visit Plan Pt to continue with personal training regimen with trainer and progress upper body resistance training drills with guidance from trainer. Pt to continue with established HEP. Discharge following today's session.    PT Home Exercise Plan See above    Consulted and Agree with Plan of Care Patient                    Consuela Mimes, PT, DPT #[id number]  Gertie Exon, PT 03/23/2023, 1:33 PM
